# Patient Record
Sex: Male | Born: 1955 | Race: White | Hispanic: No | Smoking: Current every day smoker
Health system: Southern US, Community
[De-identification: ages and names within clinical notes are randomized; demographics above are authoritative.]

## PROBLEM LIST (undated history)

## (undated) DIAGNOSIS — I2510 Atherosclerotic heart disease of native coronary artery without angina pectoris: Secondary | ICD-10-CM

## (undated) DIAGNOSIS — I82409 Acute embolism and thrombosis of unspecified deep veins of unspecified lower extremity: Secondary | ICD-10-CM

## (undated) DIAGNOSIS — I10 Essential (primary) hypertension: Secondary | ICD-10-CM

## (undated) DIAGNOSIS — M1990 Unspecified osteoarthritis, unspecified site: Secondary | ICD-10-CM

## (undated) DIAGNOSIS — E785 Hyperlipidemia, unspecified: Secondary | ICD-10-CM

## (undated) DIAGNOSIS — F17200 Nicotine dependence, unspecified, uncomplicated: Secondary | ICD-10-CM

## (undated) HISTORY — PX: LUMBAR LAMINECTOMY: SHX95

---

## 2009-01-22 HISTORY — PX: VASCULAR SURGERY: SHX849

## 2009-11-19 ENCOUNTER — Encounter: Payer: Self-pay | Admitting: Vascular Surgery

## 2009-11-19 ENCOUNTER — Ambulatory Visit: Payer: Self-pay | Admitting: Vascular Surgery

## 2009-11-19 ENCOUNTER — Inpatient Hospital Stay (HOSPITAL_COMMUNITY)
Admission: EM | Admit: 2009-11-19 | Discharge: 2009-11-22 | Payer: Self-pay | Source: Home / Self Care | Admitting: Emergency Medicine

## 2009-11-22 ENCOUNTER — Encounter: Payer: Self-pay | Admitting: Vascular Surgery

## 2009-12-06 ENCOUNTER — Ambulatory Visit: Payer: Self-pay | Admitting: Vascular Surgery

## 2010-04-04 LAB — CBC
HCT: 40.3 % (ref 39.0–52.0)
Hemoglobin: 13.8 g/dL (ref 13.0–17.0)
MCHC: 34.2 g/dL (ref 30.0–36.0)
MCV: 85.2 fL (ref 78.0–100.0)
RDW: 13.1 % (ref 11.5–15.5)
WBC: 8.4 10*3/uL (ref 4.0–10.5)

## 2010-04-04 LAB — BASIC METABOLIC PANEL
BUN: 11 mg/dL (ref 6–23)
Chloride: 100 mEq/L (ref 96–112)
Glucose, Bld: 99 mg/dL (ref 70–99)
Potassium: 4.7 mEq/L (ref 3.5–5.1)
Sodium: 136 mEq/L (ref 135–145)

## 2010-04-05 LAB — BASIC METABOLIC PANEL
BUN: 11 mg/dL (ref 6–23)
BUN: 6 mg/dL (ref 6–23)
CO2: 25 mEq/L (ref 19–32)
CO2: 29 mEq/L (ref 19–32)
Chloride: 102 mEq/L (ref 96–112)
GFR calc non Af Amer: >60 mL/min (ref >60)
GFR calc non Af Amer: >60 mL/min (ref >60)
Glucose, Bld: 105 mg/dL — ABNORMAL HIGH (ref 70–99)
Glucose, Bld: 107 mg/dL — ABNORMAL HIGH (ref 70–99)
Potassium: 3.8 mEq/L (ref 3.5–5.1)
Potassium: 4 mEq/L (ref 3.5–5.1)
Sodium: 137 mEq/L (ref 135–145)

## 2010-04-05 LAB — CBC
HCT: 38.7 % — ABNORMAL LOW (ref 39.0–52.0)
HCT: 42.7 % (ref 39.0–52.0)
Hemoglobin: 13.1 g/dL (ref 13.0–17.0)
Hemoglobin: 14.8 g/dL (ref 13.0–17.0)
MCH: 29 pg (ref 26.0–34.0)
MCHC: 33.9 g/dL (ref 30.0–36.0)
MCV: 84.6 fL (ref 78.0–100.0)
MCV: 85.8 fL (ref 78.0–100.0)
RDW: 13.3 % (ref 11.5–15.5)
RDW: 13.5 % (ref 11.5–15.5)
WBC: 12.1 10*3/uL — ABNORMAL HIGH (ref 4.0–10.5)

## 2010-04-05 LAB — DIFFERENTIAL
Basophils Absolute: 0.1 10*3/uL (ref 0.0–0.1)
Eosinophils Relative: 2 % (ref 0–5)
Lymphocytes Relative: 23 % (ref 12–46)
Lymphs Abs: 2.7 10*3/uL (ref 0.7–4.0)
Monocytes Absolute: 0.6 10*3/uL (ref 0.1–1.0)
Monocytes Relative: 5 % (ref 3–12)
Neutro Abs: 8.4 10*3/uL — ABNORMAL HIGH (ref 1.7–7.7)

## 2010-04-05 LAB — APTT: aPTT: 33 seconds (ref 24–37)

## 2010-06-06 NOTE — Assessment & Plan Note (Signed)
OFFICE VISIT   James Porter, James Porter  DOB:  Feb 21, 1955                                       12/06/2009  CHART#:[id number]   James Porter presents today for follow-up of right femoral  thrombectomy on November 19, 2009.  He presented in her insulin with  acute ischemia.  He underwent thrombectomy and intraoperative  arteriogram.  He had no identifiable source for the thrombus.  He  underwent a 2-D echo which was negative and also  underwent a formal  arch and lower extremity arteriogram.  This did show mild to moderate  narrowing in the mid superficial femoral artery but otherwise normal.  He has  returned to his usual baseline.  He does report some numbness in  the medial thigh and I explained this was related to the skin incision  in his groin.  He has normal posterior tibial pulses bilaterally and  dorsalis pedis pulses bilaterally.  He has normal ankle arm indices and  normal waveforms bilaterally as well.  He will return to his usual  activities and has been released to return to work on November 30.  He  will see [place] again in 3 months for final follow-up.     Larina Earthly, M.D.  Electronically Signed   TFE/MEDQ  D:  12/06/2009  T:  12/07/2009  Job:  [id number]

## 2011-12-06 ENCOUNTER — Encounter: Payer: Self-pay | Admitting: Vascular Surgery

## 2017-04-17 ENCOUNTER — Ambulatory Visit: Payer: Medicare HMO | Admitting: Anesthesiology

## 2017-04-17 ENCOUNTER — Encounter
Admission: RE | Disposition: A | Discharge status: Home / Self Care | Payer: Self-pay | Source: Ambulatory Visit | Attending: Surgery

## 2017-04-17 ENCOUNTER — Ambulatory Visit
Admission: RE | Admit: 2017-04-17 | Discharge: 2017-04-17 | Disposition: A | Discharge status: Home / Self Care | Payer: Medicare HMO | Source: Ambulatory Visit | Attending: Surgery | Admitting: Surgery

## 2017-04-17 DIAGNOSIS — G5621 Lesion of ulnar nerve, right upper limb: Secondary | ICD-10-CM | POA: Insufficient documentation

## 2017-04-17 DIAGNOSIS — F17210 Nicotine dependence, cigarettes, uncomplicated: Secondary | ICD-10-CM | POA: Insufficient documentation

## 2017-04-17 DIAGNOSIS — Z79899 Other long term (current) drug therapy: Secondary | ICD-10-CM | POA: Insufficient documentation

## 2017-04-17 DIAGNOSIS — Z86718 Personal history of other venous thrombosis and embolism: Secondary | ICD-10-CM | POA: Diagnosis not present

## 2017-04-17 DIAGNOSIS — M1990 Unspecified osteoarthritis, unspecified site: Secondary | ICD-10-CM | POA: Diagnosis not present

## 2017-04-17 DIAGNOSIS — G5601 Carpal tunnel syndrome, right upper limb: Secondary | ICD-10-CM | POA: Diagnosis present

## 2017-04-17 HISTORY — DX: Unspecified osteoarthritis, unspecified site: M19.90

## 2017-04-17 HISTORY — DX: Acute embolism and thrombosis of unspecified deep veins of unspecified lower extremity: I82.409

## 2017-04-17 HISTORY — PX: CARPAL TUNNEL RELEASE: SHX101

## 2017-04-17 SURGERY — RELEASE, CARPAL TUNNEL, ENDOSCOPIC
Anesthesia: Regional | Laterality: Right | Wound class: "Clean "

## 2017-04-17 MED ORDER — CEFAZOLIN SODIUM-DEXTROSE 2-4 GM/100ML-% IV SOLN
2.0000 g | Freq: Once | INTRAVENOUS | Status: AC
  Administered 2017-04-17: 2 g via INTRAVENOUS

## 2017-04-17 MED ORDER — FENTANYL CITRATE (PF) 100 MCG/2ML IJ SOLN: 25.0000 ug | INTRAMUSCULAR | Status: DC | PRN

## 2017-04-17 MED ORDER — ONDANSETRON HCL 4 MG/2ML IJ SOLN: 4.0000 mg | Freq: Once | INTRAMUSCULAR | Status: DC | PRN

## 2017-04-17 MED ORDER — LIDOCAINE HCL (CARDIAC) 20 MG/ML IV SOLN
INTRAVENOUS | Status: DC | PRN
  Administered 2017-04-17: 50 mg via INTRAVENOUS

## 2017-04-17 MED ORDER — EPHEDRINE SULFATE 50 MG/ML IJ SOLN
INTRAMUSCULAR | Status: DC | PRN
  Administered 2017-04-17 (×4): 5 mg via INTRAVENOUS

## 2017-04-17 MED ORDER — PROPOFOL 500 MG/50ML IV EMUL
INTRAVENOUS | Status: DC | PRN
  Administered 2017-04-17: 75 ug/kg/min via INTRAVENOUS

## 2017-04-17 MED ORDER — FENTANYL CITRATE (PF) 100 MCG/2ML IJ SOLN
INTRAMUSCULAR | Status: DC | PRN
  Administered 2017-04-17: 50 ug via INTRAVENOUS

## 2017-04-17 MED ORDER — HYDROCODONE-ACETAMINOPHEN 5-325 MG PO TABS: 1.0000 | ORAL_TABLET | Freq: Four times a day (QID) | ORAL | 0 refills | Status: DC | PRN

## 2017-04-17 MED ORDER — OXYCODONE HCL 5 MG/5ML PO SOLN: 5.0000 mg | Freq: Once | ORAL | Status: DC | PRN

## 2017-04-17 MED ORDER — ACETAMINOPHEN 10 MG/ML IV SOLN: 1000.0000 mg | Freq: Once | INTRAVENOUS | Status: DC | PRN

## 2017-04-17 MED ORDER — ROPIVACAINE HCL 5 MG/ML IJ SOLN
INTRAMUSCULAR | Status: DC | PRN
  Administered 2017-04-17: 35 mL

## 2017-04-17 MED ORDER — LACTATED RINGERS IV SOLN: INTRAVENOUS | Status: DC

## 2017-04-17 MED ORDER — OXYCODONE HCL 5 MG PO TABS: 5.0000 mg | ORAL_TABLET | Freq: Once | ORAL | Status: DC | PRN

## 2017-04-17 MED ORDER — BUPIVACAINE HCL (PF) 0.5 % IJ SOLN
INTRAMUSCULAR | Status: DC | PRN
  Administered 2017-04-17: 20 mL

## 2017-04-17 MED ORDER — MIDAZOLAM HCL 2 MG/2ML IJ SOLN
INTRAMUSCULAR | Status: DC | PRN
  Administered 2017-04-17: 1 mg via INTRAVENOUS

## 2017-04-17 MED ORDER — LACTATED RINGERS IV SOLN
INTRAVENOUS | Status: DC
  Administered 2017-04-17: 13:00:00 via INTRAVENOUS

## 2017-04-17 SURGICAL SUPPLY — 32 items
BANDAGE ELASTIC 2 LF NS (GAUZE/BANDAGES/DRESSINGS) ×3 IMPLANT
BANDAGE ELASTIC 3 VELCRO NS (GAUZE/BANDAGES/DRESSINGS) ×2 IMPLANT
BENZOIN TINCTURE PRP APPL 2/3 (GAUZE/BANDAGES/DRESSINGS) ×2 IMPLANT
BNDG COHESIVE 4X5 TAN STRL (GAUZE/BANDAGES/DRESSINGS) ×3 IMPLANT
BNDG ESMARK 4X12 TAN STRL LF (GAUZE/BANDAGES/DRESSINGS) ×3 IMPLANT
CHLORAPREP W/TINT 26ML (MISCELLANEOUS) ×3 IMPLANT
CLOSURE WOUND 1/4X4 (GAUZE/BANDAGES/DRESSINGS) ×1
CORD BIP STRL DISP 12FT (MISCELLANEOUS) ×3 IMPLANT
COVER LIGHT HANDLE UNIVERSAL (MISCELLANEOUS) ×6 IMPLANT
CUFF TOURN SGL QUICK 18 (TOURNIQUET CUFF) ×2 IMPLANT
DRAPE SURG 17X11 SM STRL (DRAPES) ×3 IMPLANT
GAUZE PETRO XEROFOAM 1X8 (MISCELLANEOUS) ×3 IMPLANT
GAUZE SPONGE 4X4 12PLY STRL (GAUZE/BANDAGES/DRESSINGS) ×3 IMPLANT
GLOVE BIO SURGEON STRL SZ8 (GLOVE) ×3 IMPLANT
GLOVE INDICATOR 8.0 STRL GRN (GLOVE) ×3 IMPLANT
GOWN STRL REUS W/ TWL LRG LVL3 (GOWN DISPOSABLE) ×1 IMPLANT
GOWN STRL REUS W/ TWL XL LVL3 (GOWN DISPOSABLE) ×1 IMPLANT
GOWN STRL REUS W/TWL LRG LVL3 (GOWN DISPOSABLE) ×2
GOWN STRL REUS W/TWL XL LVL3 (GOWN DISPOSABLE) ×2
KIT CARPAL TUNNEL (MISCELLANEOUS) ×2
KIT ESCP INSRT D SLOT CANN KN (MISCELLANEOUS) ×1 IMPLANT
KIT TURNOVER KIT A (KITS) ×3 IMPLANT
NS IRRIG 500ML POUR BTL (IV SOLUTION) ×3 IMPLANT
PACK EXTREMITY ARMC (MISCELLANEOUS) ×3 IMPLANT
SLING ARM LRG DEEP (SOFTGOODS) ×2 IMPLANT
SPLINT WRIST LG RT TX900304 (SOFTGOODS) ×2 IMPLANT
STOCKINETTE IMPERVIOUS 9X36 MD (GAUZE/BANDAGES/DRESSINGS) ×3 IMPLANT
STRAP BODY AND KNEE 60X3 (MISCELLANEOUS) ×3 IMPLANT
STRIP CLOSURE SKIN 1/4X4 (GAUZE/BANDAGES/DRESSINGS) ×1 IMPLANT
SUT PROLENE 4 0 PS 2 18 (SUTURE) ×3 IMPLANT
SUT VIC AB 3-0 SH 27 (SUTURE) ×2
SUT VIC AB 3-0 SH 27X BRD (SUTURE) IMPLANT

## 2017-04-17 NOTE — Anesthesia Procedure Notes (Signed)
Anesthesia Regional Block: Supraclavicular block   Pre-Anesthetic Checklist: ,, timeout performed, Correct Patient, Correct Site, Correct Laterality, Correct Procedure, Correct Position, site marked, Risks and benefits discussed,  Surgical consent,  Pre-op evaluation,  At surgeon's request and post-op pain management  Laterality: Right  Prep: chloraprep       Needles:  Injection technique: Single-shot  Needle Type: Stimiplex     Needle Length: 4cm  Needle Gauge: 22     Additional Needles:   Procedures:,,,, ultrasound used (permanent image in chart),,,,  Narrative:  Start time: 04/17/2017 12:03 PM End time: 04/17/2017 12:10 PM Injection made incrementally with aspirations every 5 mL.  Performed by: Personally  Anesthesiologist: Reed BreechMazzoni, Cherlyn Syring, MD  Additional Notes: Functioning IV was confirmed and monitors applied.  Sterile prep and drape,hand hygiene and sterile gloves were used.Ultrasound guidance: relevant anatomy identified, needle position confirmed, local anesthetic spread visualized around nerve(s)., vascular puncture avoided.  Image printed for medical record.  Negative aspiration and negative test dose prior to incremental administration of local anesthetic. The patient tolerated the procedure well. Vitals signes recorded in RN notes.

## 2017-04-17 NOTE — Anesthesia Procedure Notes (Signed)
Performed by: Jillann Charette, CRNA Pre-anesthesia Checklist: Patient identified, Emergency Drugs available, Suction available, Patient being monitored and Timeout performed Patient Re-evaluated:Patient Re-evaluated prior to induction Oxygen Delivery Method: Simple face mask Placement Confirmation: positive ETCO2 and breath sounds checked- equal and bilateral       

## 2017-04-17 NOTE — Discharge Instructions (Addendum)
General Anesthesia, Adult, Care After These instructions provide you with information about caring for yourself after your procedure. Your health care provider may also give you more specific instructions. Your treatment has been planned according to current medical practices, but problems sometimes occur. Call your health care provider if you have any problems or questions after your procedure. What can I expect after the procedure? After the procedure, it is common to have:  Vomiting.  A sore throat.  Mental slowness.  It is common to feel:  Nauseous.  Cold or shivery.  Sleepy.  Tired.  Sore or achy, even in parts of your body where you did not have surgery.  Follow these instructions at home: For at least 24 hours after the procedure:  Do not: ? Participate in activities where you could fall or become injured. ? Drive. ? Use heavy machinery. ? Drink alcohol. ? Take sleeping pills or medicines that cause drowsiness. ? Make important decisions or sign legal documents. ? Take care of children on your own.  Rest. Eating and drinking  If you vomit, drink water, juice, or soup when you can drink without vomiting.  Drink enough fluid to keep your urine clear or pale yellow.  Make sure you have little or no nausea before eating solid foods.  Follow the diet recommended by your health care provider. General instructions  Have a responsible adult stay with you until you are awake and alert.  Return to your normal activities as told by your health care provider. Ask your health care provider what activities are safe for you.  Take over-the-counter and prescription medicines only as told by your health care provider.  If you smoke, do not smoke without supervision.  Keep all follow-up visits as told by your health care provider. This is important. Contact a health care provider if:  You continue to have nausea or vomiting at home, and medicines are not helpful.  You  cannot drink fluids or start eating again.  You cannot urinate after 8-12 hours.  You develop a skin rash.  You have fever.  You have increasing redness at the site of your procedure. Get help right away if:  You have difficulty breathing.  You have chest pain.  You have unexpected bleeding.  You feel that you are having a life-threatening or urgent problem. This information is not intended to replace advice given to you by your health care provider. Make sure you discuss any questions you have with your health care provider. Document Released: 04/16/2000 Document Revised: 06/13/2015 Document Reviewed: 12/23/2014 Elsevier Interactive Patient Education  2018 [employer]   Keep dressing dry and intact. Keep hand elevated above heart level. May shower after dressing removed on postop day 4 (Sunday). Cover sutures with Band-Aids after drying off, then re-apply velcro wrist splint. Apply ice to affected area frequently. Take ibuprofen 600 mg TID with meals for 7-10 days, then as necessary. Take ES Tylenol or pain medication as prescribed when needed.  Return for follow-up in 10-14 days or as scheduled.

## 2017-04-17 NOTE — Op Note (Signed)
04/17/2017  2:00 PM  Patient:   James Porter  Pre-Op Diagnosis:   1. Right carpal tunnel syndrome.  2. Right cubital tunnel syndrome.  Post-Op Diagnosis:   Same.  Procedure:   1. Endoscopic right carpal tunnel release.  2. Open decompression of ulnar nerve at right elbow.  Surgeon:   Maryagnes Amos, MD  Anesthesia:   Bier block  Findings:   As above.  Complications:   None  EBL:   0 cc  Fluids:   450 cc crystalloid  TT:   40 minutes at 250 mmHg  Drains:   None  Closure:   4-0 Prolene interrupted sutures for wrist, 3-0 Vicryl subcuticular sutures for elbow  Brief Clinical Note:   The patient is a 62 year old male with a history of progressively worsening right hand and upper extremity pain and paresthesias. His symptoms have progressed despite medications, activity modification, splinting, etc. His history and examination are consistent with both carpal tunnel syndrome and cubital tunnel syndrome as confirmed by EMG studies. The patient presents at this time for an endoscopic right carpal tunnel release as well as a decompression with possible subcutaneous anterior transposition of the ulnar nerve at the right elbow.   Procedure:   The patient underwent placement of a supraclavicular block by the anesthesiologist in the preoperative holding area before he was brought into the operating room and lain in the supine position. After adequate IV sedation was achieved, the right hand and upper extremity were prepped with ChloraPrep solution before being draped sterilely. Preoperative antibiotics were administered. A timeout was performed to verify the appropriate surgical site before the limb was exsanguinated with a Esmarch and the tourniquet inflated to 250 mmHg. An approximately 1.5-2 cm incision was made over the volar wrist flexion crease, centered over the palmaris longus tendon. The incision was carried down through the subcutaneous tissues with care taken to identify and  protect any neurovascular structures. The distal forearm fascia was penetrated just proximal to the transverse carpal ligament. The soft tissues were released off the superficial and deep surfaces of the distal forearm fascia and this was released proximally for 3-4 cm under direct visualization.  Attention was directed distally. The Therapist, nutritional was passed beneath the transverse carpal ligament along the ulnar aspect of the carpal tunnel and used to release any adhesions as well as to remove any adherent synovial tissue before first the smaller then the larger of the two dilators were passed beneath the transverse carpal ligament along the ulnar margin of the carpal tunnel. The slotted cannula was introduced and the endoscope was placed into the slotted cannula and the undersurface of the transverse carpal ligament visualized. The distal margin of the transverse carpal ligament was marked by placing a 25-gauge needle percutaneously at Kaplan's cardinal point so that it entered the distal portion of the slotted cannula. Under endoscopic visualization, the transverse carpal ligament was released from proximal to distal using the end-cutting blade. A second pass was performed to ensure complete release of the ligament. The adequacy of release was verified both endoscopically and by palpation using the freer elevator.  The wound was irrigated thoroughly with sterile saline solution before being closed using 4-0 Prolene interrupted sutures. A total of 10 cc of 0.5% plain Sensorcaine was injected in and around the incision.  Attention was directed to the right elbow. An approximately 7-8 cm curvilinear incision was made along the course of the ulnar nerve posterior to the medial epicondyle. The incision was carried  down through the subcutaneous tissues with care taken to avoid the small branches of the medial antebrachial nerve to expose the sheath overlying the cubital tunnel. The ulnar nerve was identified at  the proximal end of the tunnel and was dissected free. The nerve was then carefully followed as the roof of the cubital tunnel was released from proximal to distal. Distally, the fascia overlying the pronator muscle was released for several centimeters. The nerve was clearly thickened just proximal to fascia overlying the cubital tunnel, indicating the most likely source of the nerve compression. Near-circumferential dissection was carried out under loupe magnification using bipolar electrocautery and tenotomy scissors to be sure that the nerve was adequately decompressed. Given the size of the patient, it was felt best to leave the nerve in situ rather than to transpose the nerve anteriorly as there was very little subcutaneous fat to work with.  The wound was copiously irrigated with sterile saline solution before the subcutaneous tissues were closed using 3-0 Vicryl interrupted sutures. The skin was closed using benzoin and Steri-Strips. A total of 10 cc of 0.5% plain Sensorcaine was injected in and around the incision to help with postoperative analgesia. Sterile bulky dressings were applied to both wounds. The patient was placed into a volar wrist splint and a sling before being awakened and returned to the recovery room in satisfactory condition after tolerating the procedure well.

## 2017-04-17 NOTE — H&P (Signed)
Paper H&P to be scanned into permanent record. H&P reviewed and patient re-examined. No changes. 

## 2017-04-17 NOTE — Transfer of Care (Signed)
Immediate Anesthesia Transfer of Care Note  [facility]  Procedure(s) Performed: RIGHT CARPAL TUNNEL RELEASE ENDOSCOPIC WITH ANTERIOR TRANSPOSITION OF ULNAR NERVE OF RIGHT ELBOW (Right )  Patient Location: PACU  Anesthesia Type: General, Regional  Level of Consciousness: awake, alert  and patient cooperative  Airway and Oxygen Therapy: Patient Spontanous Breathing and Patient connected to supplemental oxygen  Post-op Assessment: Post-op Vital signs reviewed, Patient's Cardiovascular Status Stable, Respiratory Function Stable, Patent Airway and No signs of Nausea or vomiting  Post-op Vital Signs: Reviewed and stable  Complications: No apparent anesthesia complications

## 2017-04-17 NOTE — Anesthesia Preprocedure Evaluation (Signed)
Anesthesia Evaluation  Patient identified by MRN, date of birth, ID band Patient awake    Reviewed: Allergy & Precautions, NPO status , Patient's Chart, lab work & pertinent test results  History of Anesthesia Complications Negative for: history of anesthetic complications  Airway Mallampati: I  TM Distance: >3 FB Neck ROM: Full    Dental  (+) Edentulous Upper, Edentulous Lower   Pulmonary Current Smoker (1/2 ppd),    Pulmonary exam normal breath sounds clear to auscultation       Cardiovascular Exercise Tolerance: Good negative cardio ROS Normal cardiovascular exam Rhythm:Regular Rate:Normal     Neuro/Psych negative neurological ROS     GI/Hepatic negative GI ROS,   Endo/Other  negative endocrine ROS  Renal/GU negative Renal ROS     Musculoskeletal  (+) Arthritis , Osteoarthritis,    Abdominal   Peds  Hematology LE DVT 2011; not on anticoagulation   Anesthesia Other Findings   Reproductive/Obstetrics                             Anesthesia Physical Anesthesia Plan  ASA: II  Anesthesia Plan: General and Regional   Post-op Pain Management:  Regional for Post-op pain and GA combined w/ Regional for post-op pain   Induction: Intravenous  PONV Risk Score and Plan: 1 and Ondansetron  Airway Management Planned: Natural Airway  Additional Equipment:   Intra-op Plan:   Post-operative Plan:   Informed Consent: I have reviewed the patients History and Physical, chart, labs and discussed the procedure including the risks, benefits and alternatives for the proposed anesthesia with the patient or authorized representative who has indicated his/her understanding and acceptance.     Plan Discussed with: CRNA  Anesthesia Plan Comments: (Plan for supraclavicular nerve block and GA with natural airway (LMA backup).)        Anesthesia Quick Evaluation

## 2017-04-17 NOTE — Anesthesia Postprocedure Evaluation (Signed)
Anesthesia Post Note  Patient: James Porter  Procedure(s) Performed: RIGHT CARPAL TUNNEL RELEASE ENDOSCOPIC WITH ANTERIOR TRANSPOSITION OF ULNAR NERVE OF RIGHT ELBOW (Right )  Patient location during evaluation: PACU Anesthesia Type: Regional Level of consciousness: awake and alert, oriented and patient cooperative Pain management: pain level controlled Vital Signs Assessment: post-procedure vital signs reviewed and stable Respiratory status: spontaneous breathing, nonlabored ventilation and respiratory function stable Cardiovascular status: blood pressure returned to baseline and stable Postop Assessment: adequate PO intake Anesthetic complications: no    Reed BreechAndrea Surya Folden

## 2017-04-18 ENCOUNTER — Encounter: Payer: Self-pay | Admitting: Surgery

## 2020-06-06 ENCOUNTER — Inpatient Hospital Stay
Admission: EM | Admit: 2020-06-06 | Discharge: 2020-06-09 | DRG: 208 | Disposition: A | Discharge status: Skilled Nursing Facility | Payer: Medicare Other | Attending: Internal Medicine | Admitting: Internal Medicine

## 2020-06-06 ENCOUNTER — Inpatient Hospital Stay: Payer: Medicare Other

## 2020-06-06 ENCOUNTER — Emergency Department: Payer: Medicare Other

## 2020-06-06 ENCOUNTER — Other Ambulatory Visit: Payer: Self-pay

## 2020-06-06 ENCOUNTER — Encounter: Payer: Self-pay | Admitting: Pulmonary Disease

## 2020-06-06 DIAGNOSIS — Z79899 Other long term (current) drug therapy: Secondary | ICD-10-CM

## 2020-06-06 DIAGNOSIS — E785 Hyperlipidemia, unspecified: Secondary | ICD-10-CM | POA: Diagnosis present

## 2020-06-06 DIAGNOSIS — R4182 Altered mental status, unspecified: Secondary | ICD-10-CM | POA: Diagnosis present

## 2020-06-06 DIAGNOSIS — I2584 Coronary atherosclerosis due to calcified coronary lesion: Secondary | ICD-10-CM | POA: Diagnosis not present

## 2020-06-06 DIAGNOSIS — Z20822 Contact with and (suspected) exposure to covid-19: Secondary | ICD-10-CM | POA: Diagnosis present

## 2020-06-06 DIAGNOSIS — R55 Syncope and collapse: Secondary | ICD-10-CM

## 2020-06-06 DIAGNOSIS — R778 Other specified abnormalities of plasma proteins: Secondary | ICD-10-CM | POA: Diagnosis present

## 2020-06-06 DIAGNOSIS — J690 Pneumonitis due to inhalation of food and vomit: Secondary | ICD-10-CM | POA: Diagnosis present

## 2020-06-06 DIAGNOSIS — G9341 Metabolic encephalopathy: Secondary | ICD-10-CM | POA: Diagnosis present

## 2020-06-06 DIAGNOSIS — M62838 Other muscle spasm: Secondary | ICD-10-CM | POA: Diagnosis present

## 2020-06-06 DIAGNOSIS — R6510 Systemic inflammatory response syndrome (SIRS) of non-infectious origin without acute organ dysfunction: Secondary | ICD-10-CM | POA: Diagnosis present

## 2020-06-06 DIAGNOSIS — F17210 Nicotine dependence, cigarettes, uncomplicated: Secondary | ICD-10-CM | POA: Diagnosis present

## 2020-06-06 DIAGNOSIS — R404 Transient alteration of awareness: Secondary | ICD-10-CM | POA: Diagnosis present

## 2020-06-06 DIAGNOSIS — Z86718 Personal history of other venous thrombosis and embolism: Secondary | ICD-10-CM | POA: Diagnosis not present

## 2020-06-06 DIAGNOSIS — I10 Essential (primary) hypertension: Secondary | ICD-10-CM | POA: Diagnosis present

## 2020-06-06 DIAGNOSIS — J9601 Acute respiratory failure with hypoxia: Secondary | ICD-10-CM

## 2020-06-06 DIAGNOSIS — T1490XA Injury, unspecified, initial encounter: Secondary | ICD-10-CM

## 2020-06-06 DIAGNOSIS — Z01818 Encounter for other preprocedural examination: Secondary | ICD-10-CM

## 2020-06-06 DIAGNOSIS — R402431 Glasgow coma scale score 3-8, in the field [EMT or ambulance]: Secondary | ICD-10-CM | POA: Diagnosis not present

## 2020-06-06 DIAGNOSIS — M1990 Unspecified osteoarthritis, unspecified site: Secondary | ICD-10-CM | POA: Diagnosis present

## 2020-06-06 DIAGNOSIS — R230 Cyanosis: Secondary | ICD-10-CM | POA: Diagnosis present

## 2020-06-06 DIAGNOSIS — I2510 Atherosclerotic heart disease of native coronary artery without angina pectoris: Secondary | ICD-10-CM | POA: Diagnosis present

## 2020-06-06 DIAGNOSIS — Z791 Long term (current) use of non-steroidal anti-inflammatories (NSAID): Secondary | ICD-10-CM | POA: Diagnosis not present

## 2020-06-06 DIAGNOSIS — R402433 Glasgow coma scale score 3-8, at hospital admission: Secondary | ICD-10-CM | POA: Diagnosis not present

## 2020-06-06 DIAGNOSIS — I248 Other forms of acute ischemic heart disease: Secondary | ICD-10-CM | POA: Diagnosis present

## 2020-06-06 DIAGNOSIS — D649 Anemia, unspecified: Secondary | ICD-10-CM | POA: Diagnosis present

## 2020-06-06 DIAGNOSIS — R9431 Abnormal electrocardiogram [ECG] [EKG]: Secondary | ICD-10-CM | POA: Diagnosis not present

## 2020-06-06 DIAGNOSIS — R739 Hyperglycemia, unspecified: Secondary | ICD-10-CM | POA: Diagnosis present

## 2020-06-06 DIAGNOSIS — Z23 Encounter for immunization: Secondary | ICD-10-CM | POA: Diagnosis present

## 2020-06-06 HISTORY — DX: Essential (primary) hypertension: I10

## 2020-06-06 HISTORY — DX: Hyperlipidemia, unspecified: E78.5

## 2020-06-06 HISTORY — DX: Atherosclerotic heart disease of native coronary artery without angina pectoris: I25.10

## 2020-06-06 HISTORY — DX: Nicotine dependence, unspecified, uncomplicated: F17.200

## 2020-06-06 LAB — MRSA PCR SCREENING: MRSA by PCR: NEGATIVE

## 2020-06-06 LAB — APTT: aPTT: 31 seconds (ref 24–36)

## 2020-06-06 LAB — URINE DRUG SCREEN, QUALITATIVE (ARMC ONLY)
Amphetamines, Ur Screen: NOT DETECTED
Barbiturates, Ur Screen: NOT DETECTED
Benzodiazepine, Ur Scrn: NOT DETECTED
Cannabinoid 50 Ng, Ur ~~LOC~~: POSITIVE — AB
Cocaine Metabolite,Ur ~~LOC~~: NOT DETECTED
MDMA (Ecstasy)Ur Screen: NOT DETECTED
Methadone Scn, Ur: NOT DETECTED
Opiate, Ur Screen: NOT DETECTED
Phencyclidine (PCP) Ur S: NOT DETECTED
Tricyclic, Ur Screen: POSITIVE — AB

## 2020-06-06 LAB — URINALYSIS, COMPLETE (UACMP) WITH MICROSCOPIC
Bacteria, UA: NONE SEEN
Bilirubin Urine: NEGATIVE
Glucose, UA: 50 mg/dL — AB
Hgb urine dipstick: NEGATIVE
Ketones, ur: NEGATIVE mg/dL
Leukocytes,Ua: NEGATIVE
Nitrite: NEGATIVE
Protein, ur: NEGATIVE mg/dL
Specific Gravity, Urine: 1.016 (ref 1.005–1.030)
Squamous Epithelial / LPF: NONE SEEN (ref 0–5)
pH: 6 (ref 5.0–8.0)

## 2020-06-06 LAB — STREP PNEUMONIAE URINARY ANTIGEN: Strep Pneumo Urinary Antigen: NEGATIVE

## 2020-06-06 LAB — RESP PANEL BY RT-PCR (FLU A&B, COVID) ARPGX2
Influenza A by PCR: NEGATIVE
Influenza B by PCR: NEGATIVE
SARS Coronavirus 2 by RT PCR: NEGATIVE

## 2020-06-06 LAB — CBC WITH DIFFERENTIAL/PLATELET
Abs Immature Granulocytes: 0.06 10*3/uL (ref 0.00–0.07)
Basophils Absolute: 0.1 10*3/uL (ref 0.0–0.1)
Basophils Relative: 1 %
Eosinophils Absolute: 0.3 10*3/uL (ref 0.0–0.5)
Eosinophils Relative: 3 %
HCT: 38.1 % — ABNORMAL LOW (ref 39.0–52.0)
Hemoglobin: 12.9 g/dL — ABNORMAL LOW (ref 13.0–17.0)
Immature Granulocytes: 1 %
Lymphocytes Relative: 19 %
Lymphs Abs: 1.6 10*3/uL (ref 0.7–4.0)
MCH: 28.9 pg (ref 26.0–34.0)
MCHC: 33.9 g/dL (ref 30.0–36.0)
MCV: 85.2 fL (ref 80.0–100.0)
Monocytes Absolute: 0.7 10*3/uL (ref 0.1–1.0)
Monocytes Relative: 8 %
Neutro Abs: 5.8 10*3/uL (ref 1.7–7.7)
Neutrophils Relative %: 68 %
Platelets: 216 10*3/uL (ref 150–400)
RBC: 4.47 MIL/uL (ref 4.22–5.81)
RDW: 13 % (ref 11.5–15.5)
WBC: 8.4 10*3/uL (ref 4.0–10.5)
nRBC: 0 % (ref 0.0–0.2)

## 2020-06-06 LAB — TSH: TSH: 2.693 u[IU]/mL (ref 0.350–4.500)

## 2020-06-06 LAB — COMPREHENSIVE METABOLIC PANEL
ALT: 39 U/L (ref 0–44)
AST: 36 U/L (ref 15–41)
Albumin: 3.5 g/dL (ref 3.5–5.0)
Alkaline Phosphatase: 62 U/L (ref 38–126)
Anion gap: 7 (ref 5–15)
BUN: 18 mg/dL (ref 8–23)
CO2: 24 mmol/L (ref 22–32)
Calcium: 8.2 mg/dL — ABNORMAL LOW (ref 8.9–10.3)
Chloride: 108 mmol/L (ref 98–111)
Creatinine, Ser: 1.06 mg/dL (ref 0.61–1.24)
GFR, Estimated: >60 mL/min (ref >60)
Glucose, Bld: 152 mg/dL — ABNORMAL HIGH (ref 70–99)
Potassium: 3.9 mmol/L (ref 3.5–5.1)
Sodium: 139 mmol/L (ref 135–145)
Total Bilirubin: 0.7 mg/dL (ref 0.3–1.2)
Total Protein: 6 g/dL — ABNORMAL LOW (ref 6.5–8.1)

## 2020-06-06 LAB — HIV ANTIBODY (ROUTINE TESTING W REFLEX): HIV Screen 4th Generation wRfx: NONREACTIVE

## 2020-06-06 LAB — PROTIME-INR
INR: 1 (ref 0.8–1.2)
Prothrombin Time: 12.9 seconds (ref 11.4–15.2)

## 2020-06-06 LAB — CBG MONITORING, ED: Glucose-Capillary: 133 mg/dL — ABNORMAL HIGH (ref 70–99)

## 2020-06-06 LAB — TROPONIN I (HIGH SENSITIVITY)
Troponin I (High Sensitivity): 26 ng/L — ABNORMAL HIGH (ref <18)
Troponin I (High Sensitivity): 216 ng/L (ref <18)
Troponin I (High Sensitivity): 291 ng/L (ref <18)

## 2020-06-06 LAB — T4, FREE: Free T4: 1.16 ng/dL — ABNORMAL HIGH (ref 0.61–1.12)

## 2020-06-06 LAB — PROCALCITONIN: Procalcitonin: <0.1 ng/mL

## 2020-06-06 LAB — GLUCOSE, CAPILLARY
Glucose-Capillary: 144 mg/dL — ABNORMAL HIGH (ref 70–99)
Glucose-Capillary: 128 mg/dL — ABNORMAL HIGH (ref 70–99)
Glucose-Capillary: 113 mg/dL — ABNORMAL HIGH (ref 70–99)

## 2020-06-06 LAB — ACETAMINOPHEN LEVEL: Acetaminophen (Tylenol), Serum: <10 ug/mL — ABNORMAL LOW (ref 10–30)

## 2020-06-06 LAB — ETHANOL: Alcohol, Ethyl (B): <10 mg/dL (ref <10)

## 2020-06-06 LAB — CORTISOL: Cortisol, Plasma: 12.2 ug/dL

## 2020-06-06 LAB — SALICYLATE LEVEL: Salicylate Lvl: <7 mg/dL — ABNORMAL LOW (ref 7.0–30.0)

## 2020-06-06 MED ORDER — PNEUMOCOCCAL VAC POLYVALENT 25 MCG/0.5ML IJ INJ
0.5000 mL | INJECTION | INTRAMUSCULAR | Status: AC
  Administered 2020-06-09: 0.5 mL via INTRAMUSCULAR
  Filled 2020-06-06: qty 0.5

## 2020-06-06 MED ORDER — DOCUSATE SODIUM 50 MG/5ML PO LIQD
100.0000 mg | Freq: Two times a day (BID) | ORAL | Status: DC | PRN
  Filled 2020-06-06: qty 10

## 2020-06-06 MED ORDER — DOCUSATE SODIUM 100 MG PO CAPS: 100.0000 mg | ORAL_CAPSULE | Freq: Two times a day (BID) | ORAL | Status: DC | PRN

## 2020-06-06 MED ORDER — CHLORHEXIDINE GLUCONATE 0.12% ORAL RINSE (MEDLINE KIT)
15.0000 mL | Freq: Two times a day (BID) | OROMUCOSAL | Status: DC
  Administered 2020-06-06 – 2020-06-07 (×2): 15 mL via OROMUCOSAL
  Filled 2020-06-06 (×2): qty 15

## 2020-06-06 MED ORDER — PROPOFOL 1000 MG/100ML IV EMUL
5.0000 ug/kg/min | INTRAVENOUS | Status: DC
  Administered 2020-06-06: 5 ug/kg/min via INTRAVENOUS
  Administered 2020-06-06: 10 ug/kg/min via INTRAVENOUS
  Filled 2020-06-06 (×2): qty 100

## 2020-06-06 MED ORDER — SODIUM CHLORIDE 0.9 % IV SOLN
250.0000 mL | INTRAVENOUS | Status: DC
  Administered 2020-06-06: 250 mL via INTRAVENOUS

## 2020-06-06 MED ORDER — ETOMIDATE 2 MG/ML IV SOLN
20.0000 mg | Freq: Once | INTRAVENOUS | Status: AC
  Administered 2020-06-06: 20 mg via INTRAVENOUS

## 2020-06-06 MED ORDER — CHLORHEXIDINE GLUCONATE CLOTH 2 % EX PADS
6.0000 | MEDICATED_PAD | Freq: Every day | CUTANEOUS | Status: DC
  Administered 2020-06-06 – 2020-06-07 (×2): 6 via TOPICAL
  Filled 2020-06-06: qty 6

## 2020-06-06 MED ORDER — ONDANSETRON HCL 4 MG/2ML IJ SOLN
4.0000 mg | Freq: Four times a day (QID) | INTRAMUSCULAR | Status: DC | PRN
  Administered 2020-06-07: 4 mg via INTRAVENOUS
  Filled 2020-06-06: qty 2

## 2020-06-06 MED ORDER — FENTANYL 2500MCG IN NS 250ML (10MCG/ML) PREMIX INFUSION
0.0000 ug/h | INTRAVENOUS | Status: DC
  Administered 2020-06-06: 50 ug/h via INTRAVENOUS
  Filled 2020-06-06: qty 250

## 2020-06-06 MED ORDER — DEXMEDETOMIDINE HCL IN NACL 400 MCG/100ML IV SOLN
0.4000 ug/kg/h | INTRAVENOUS | Status: DC
  Administered 2020-06-06: 0.8 ug/kg/h via INTRAVENOUS
  Administered 2020-06-06: 0.4 ug/kg/h via INTRAVENOUS
  Administered 2020-06-07: 0.8 ug/kg/h via INTRAVENOUS
  Filled 2020-06-06 (×3): qty 100

## 2020-06-06 MED ORDER — IPRATROPIUM-ALBUTEROL 0.5-2.5 (3) MG/3ML IN SOLN: 3.0000 mL | RESPIRATORY_TRACT | Status: DC | PRN

## 2020-06-06 MED ORDER — LACTATED RINGERS IV SOLN: INTRAVENOUS | Status: DC

## 2020-06-06 MED ORDER — ENOXAPARIN SODIUM 40 MG/0.4ML IJ SOSY: 40.0000 mg | PREFILLED_SYRINGE | INTRAMUSCULAR | Status: DC

## 2020-06-06 MED ORDER — PANTOPRAZOLE SODIUM 40 MG IV SOLR
40.0000 mg | Freq: Every day | INTRAVENOUS | Status: DC
  Administered 2020-06-06 – 2020-06-07 (×2): 40 mg via INTRAVENOUS
  Filled 2020-06-06 (×2): qty 40

## 2020-06-06 MED ORDER — ORAL CARE MOUTH RINSE
15.0000 mL | OROMUCOSAL | Status: DC
  Administered 2020-06-06 – 2020-06-07 (×7): 15 mL via OROMUCOSAL
  Filled 2020-06-06 (×6): qty 15

## 2020-06-06 MED ORDER — SODIUM CHLORIDE 0.9 % IV SOLN: 500.0000 mg | Freq: Once | INTRAVENOUS | Status: DC

## 2020-06-06 MED ORDER — ROCURONIUM BROMIDE 50 MG/5ML IV SOLN
100.0000 mg | Freq: Once | INTRAVENOUS | Status: AC
  Administered 2020-06-06: 100 mg via INTRAVENOUS
  Filled 2020-06-06: qty 10

## 2020-06-06 MED ORDER — HEPARIN BOLUS VIA INFUSION
4000.0000 [IU] | Freq: Once | INTRAVENOUS | Status: AC
  Administered 2020-06-06: 4000 [IU] via INTRAVENOUS
  Filled 2020-06-06: qty 4000

## 2020-06-06 MED ORDER — POLYETHYLENE GLYCOL 3350 17 G PO PACK: 17.0000 g | PACK | Freq: Every day | ORAL | Status: DC | PRN

## 2020-06-06 MED ORDER — NOREPINEPHRINE 4 MG/250ML-% IV SOLN
2.0000 ug/min | INTRAVENOUS | Status: DC
  Administered 2020-06-06: 5 ug/min via INTRAVENOUS
  Filled 2020-06-06: qty 250

## 2020-06-06 MED ORDER — SODIUM CHLORIDE 0.9 % IV SOLN: 1.0000 g | Freq: Once | INTRAVENOUS | Status: DC

## 2020-06-06 MED ORDER — INSULIN ASPART 100 UNIT/ML IJ SOLN
0.0000 [IU] | INTRAMUSCULAR | Status: DC
  Administered 2020-06-06 (×2): 2 [IU] via SUBCUTANEOUS
  Administered 2020-06-09: 5 [IU] via SUBCUTANEOUS
  Filled 2020-06-06 (×3): qty 1

## 2020-06-06 MED ORDER — SODIUM CHLORIDE 0.9 % IV SOLN
3.0000 g | Freq: Four times a day (QID) | INTRAVENOUS | Status: DC
  Administered 2020-06-06 – 2020-06-08 (×7): 3 g via INTRAVENOUS
  Filled 2020-06-06: qty 8
  Filled 2020-06-06 (×2): qty 3
  Filled 2020-06-06: qty 8
  Filled 2020-06-06: qty 3
  Filled 2020-06-06: qty 8
  Filled 2020-06-06: qty 3
  Filled 2020-06-06: qty 8
  Filled 2020-06-06 (×2): qty 3
  Filled 2020-06-06: qty 8

## 2020-06-06 MED ORDER — HEPARIN (PORCINE) 25000 UT/250ML-% IV SOLN
1000.0000 [IU]/h | INTRAVENOUS | Status: DC
  Administered 2020-06-06: 1000 [IU]/h via INTRAVENOUS
  Filled 2020-06-06: qty 250

## 2020-06-06 NOTE — ED Triage Notes (Signed)
Pt from home via EMS. Wife called, found pt laying in bed "blue." Paramedics found patient apneic and blue. IO placed left lower leg. Wife concerned pt took oxy and percocet for chronic back pain, unsure how much pt might have taken. Received 14 mg narcan in route, responds briefly after each dose. GCS 3 per report.

## 2020-06-06 NOTE — ED Notes (Signed)
Pt now opening eyes spontaneously. Follows simple commands. Copious white in line secretions and clear oral secretions. Pt suctioned.

## 2020-06-06 NOTE — ED Notes (Signed)
Returned from CT. Daughter at bedside. Dr. Fuller Plan updated her.

## 2020-06-06 NOTE — ED Notes (Signed)
BAIR hugger placed to warm patient.

## 2020-06-06 NOTE — ED Notes (Signed)
Pt intubated by Dr Doris Cheadle. 8.0 ETT, 27 at the lip, positive color change. Bilateral breath sounds.

## 2020-06-06 NOTE — H&P (Signed)
NAME:  James Porter, MRN:  [id number], DOB:  07-06-1955, LOS: 0 ADMISSION DATE:  06/06/2020, CONSULTATION DATE:  06/06/2020 REFERRING MD:  Dr. Jari Pigg, CHIEF COMPLAINT: Altered mental status  Brief Pt Description/Synopsis  65 y.o. Male admitted with altered mental status, exact etiology unclear at this time (? possible drug overdose vs. ? Seizure) requiring intubation and mechanical ventilation for airway protection.  Concern for aspiration pneumonia.  History of Present Illness:  James Porter is a 65 year old man with a past medical history as listed below who presents to [facility] ED on 06/06/2020 due to altered mental status and potential drug overdose.  Patient is currently intubated, therefore history obtained from ED and nursing notes, along with telephone discussion with pt's wife.  Per notes and wife,  the patient was at home watching TV and suddenly became unresponsive, apneic, and cyanotic.  Bystander CPR was provided by his wife for approximately 20 minutes. However when EMS arrived there was a pulse. According to family they do not suspect that he intentionally overdosed on his or his wife's medications.  His wife reports "he takes BP medications, Gabapentin, and medications for muscle spasms." EMS did administer 12 mg of IV Narcan without any verbal response.  ED course: Upon arrival to the ED vital signs included: Temperature 95.5, respiratory rate 78, pulse 78, blood pressure 140/100, SPO2 98% on nonrebreather mask.  Despite Narcan he remained unresponsive with concern for ability to protect his airway, therefore he was electively intubated by ED provider due to a GCS of 3.  Work-up shows glucose 152 otherwise unremarkable comprehensive metabolic panel.  High-sensitivity troponin is 26, WBC 8.4, hemoglobin 12.9, hematocrit 38.1.  Urinalysis negative for UTI.  Urine drug screen is positive for tricyclics and marijuana.  Chest x-ray shows patchy atelectasis versus infiltrate in the left  perihilar region concerning for possible aspiration ammonia.  CT head is negative for acute intracranial abnormality, CT cervical spine is negative for acute cervical fracture.  PCCM is asked to admit the patient to ICU for further work-up and treatment of altered mental status with exact etiology unclear at this time (? possible drug overdose vs. ? seizure) requiring intubation and mechanical ventilation for airway protection.  Concern for aspiration pneumonia.   Pertinent  Medical History  Deep vein thrombosis Arthritis  Cultures  06/06/20: SARS CoV-2 & Influenza PCR>> 06/06/20: Tracheal aspirate>> 06/06/20: Strep pneumo urinary antigen>> 06/06/20: Legionella urinary antigen>> 06/06/20: Blood culture x2>>  Antimicrobials:  Azithromycin 5/16 x1 dose Ceftriaxone 5/16 x1 dose Unasyn 5/16>>  Significant Hospital Events: Including procedures, antibiotic start and stop dates in addition to other pertinent events   . 06/06/2020: Presented to ED, required intubation in ED for airway protection.  PCCM asked to admit to ICU  Interim History / Subjective:  -Presented to ED, required intubation in the ED for airway protection -Patient currently somnolent however is starting to arouse to painful stimuli and follow simple commands -Given copious amount of oral and ET tube secretions will hold off on extubation at this time -Will treat empirically for aspiration pneumonia -Hypothermic requiring Bair hugger -Hemodynamically stable, no vasopressors currently  Objective   Blood pressure (!) 151/102, pulse 67, temperature (!) 95.8 F (35.4 C), resp. rate 20, weight 90.7 kg, SpO2 97 %.    Vent Mode: AC FiO2 (%):  [45 %-100 %] 100 % Set Rate:  [16 bmp] 16 bmp Vt Set:  [550 mL] 550 mL PEEP:  [5 cmH20] 5 cmH20  No intake or output data in  the 24 hours ending 06/06/20 1700 Filed Weights   06/06/20 1520  Weight: 90.7 kg    Examination: General: Acutely ill-appearing male, laying in bed,  intubated, copious secretions coming from ET tube and orally, no acute distress HENT: Atraumatic, normocephalic, neck supple, no JVD Lungs: Coarse breath sounds bilaterally, vent assisted, even Cardiovascular: Regular rate and rhythm, S1-S2, no murmurs, rubs, gallops, 2+ distal pulses Abdomen: Soft, nontender, nondistended, no guarding rebound tenderness, bowel sounds positive x4 Extremities: Normal bulk and tone, no deformities, no edema Neuro: Somnolent, arouses to painful stimuli and moves all extremities to command now, pupils PERRLA at 3 mm bilaterally GU: Foley catheter in place  Labs/imaging that I havepersonally reviewed  (right click and "Reselect all SmartList Selections" daily)  Labs 06/06/2020: Glucose 152, BUN 18, creatinine 1.06, high-sensitivity troponin 26, WBC 8.4, hemoglobin 12.9, hematocrit 38.1, serum acetaminophen less than 10, salicylates less than 7 ABG post intubation: pH 7.36/PCO2 49/PO2 84/HCO3 27.7 Chest x-ray 06/06/2020:Endotracheal tube tip is about 2.7 cm superior to carina. Esophageal tube tip beneath the left hemidiaphragm. Right lung is grossly clear. Patchy atelectasis or minimal infiltrate in the left perihilar region. Normal heart size. No pneumothorax CT Head/Cervical Spine w/o contrast 06/06/2020:1. No evidence of acute intracranial abnormality. 2. No acute cervical spine fracture. 3. 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   Resolved Hospital Problem list     Assessment & Plan:   Intubated for airway protection in setting of Altered Mental Status Concern for possible aspiration Pneumonia -Full vent support, implement lung protective strategies -Wean FiO2 and PEEP as tolerated to maintain O2 sats greater than 92% -Follow intermittent chest x-ray and ABG as needed -Spontaneous breathing trials when respiratory parameters met and mental status permits -Implement VAP bundle -As needed  bronchodilators -Start empiric Unasyn  Meets SIRS Criteria (Hypothermic with temp. 95.5, RR 22) Suspected aspiration pneumonia -Monitor fever curve -Trend WBCs and procalcitonin -Follow cultures as above -Placed on empiric Unasyn for now pending cultures and sensitivities  Altered Mental Status, exact etiology unclear at this time (? Drug overdose vs ? seizure) Sedation needs in setting of mechanical ventilation -Maintain a RASS of 0 to -1 -Precedex/Propofol as needed to maintain RASS goal -Avoid sedating meds as able -Provide supportive care -Daily wake-up assessment -Patient is currently starting to arouse and follows some simple commands, however due to his copious secretions will hold off on extubation at this time -CT head 06/06/2020 negative for acute intracranial abnormality -Serum acetaminophen, salicylate, and ethyl alcohol are all negative -Urine drug screen is positive for tricyclics and marijuana -Obtain EEG -Check TSH and Cortisol  Mildly elevated troponin, suspect demand ischemia -Continuous cardiac monitoring -Follow QTC closely as patient positive for tricyclics on urine drug screen (initial QTc 510) -Maintain MAP greater than 65 -Trend HS Troponin  Normocytic Normochromic Anemia without overt s/sx of bleeding -Monitor for S/Sx of bleeding -Trend CBC -Lovenox for VTE Prophylaxis  -Transfuse for Hgb <7  Hyperglycemia -CBG's -SSI -Follow ICU Hypo/Hyperglycemia protocol -Check Hgb A1c    Best practice (right click and "Reselect all SmartList Selections" daily)  Diet:  NPO Pain/Anxiety/Delirium protocol (if indicated): Yes (RASS goal -1) VAP protocol (if indicated): Yes DVT prophylaxis: LMWH GI prophylaxis: PPI Glucose control:  SSI Central venous access:  N/A Arterial line:  N/A Foley: yes, and still needed Mobility:  bed rest  PT consulted: N/A Last date of multidisciplinary goals of care discussion [N/A] Code Status:  full code Disposition:  ICU  Updated pt's wife via telephone 06/06/20.  All questions answered.  Labs   CBC: Recent Labs  Lab 06/06/20 1500  WBC 8.4  NEUTROABS 5.8  HGB 12.9*  HCT 38.1*  MCV 85.2  PLT 257    Basic Metabolic Panel: Recent Labs  Lab 06/06/20 1500  NA 139  K 3.9  CL 108  CO2 24  GLUCOSE 152*  BUN 18  CREATININE 1.06  CALCIUM 8.2*   GFR: CrCl cannot be calculated (Unknown ideal weight.). Recent Labs  Lab 06/06/20 1500  WBC 8.4    Liver Function Tests: Recent Labs  Lab 06/06/20 1500  AST 36  ALT 39  ALKPHOS 62  BILITOT 0.7  PROT 6.0*  ALBUMIN 3.5   No results for input(s): LIPASE, AMYLASE in the last 168 hours. No results for input(s): AMMONIA in the last 168 hours.  ABG    Component Value Date/Time   HCO3 27.7 06/06/2020 1506   O2SAT 95.8 06/06/2020 1506     Coagulation Profile: No results for input(s): INR, PROTIME in the last 168 hours.  Cardiac Enzymes: No results for input(s): CKTOTAL, CKMB, CKMBINDEX, TROPONINI in the last 168 hours.  HbA1C: No results found for: HGBA1C  CBG: Recent Labs  Lab 06/06/20 1459  GLUCAP 133*    Review of Systems:   Unable to assess due to intubation, AMS, and critical illness  Past Medical History:  He,  has a past medical history of Arthritis and Deep vein thrombosis (DVT) ([place]).   Surgical History:   Past Surgical History:  Procedure Laterality Date  . CARPAL TUNNEL RELEASE Right 04/17/2017   Procedure: RIGHT CARPAL TUNNEL RELEASE ENDOSCOPIC WITH ANTERIOR TRANSPOSITION OF ULNAR NERVE OF RIGHT ELBOW;  Surgeon: Corky Mull, MD;  Location: [place];  Service: Orthopedics;  Laterality: Right;  . LUMBAR LAMINECTOMY    . VASCULAR SURGERY  2011   to remove blood clot in his leg     Social History:   reports that he has been smoking cigarettes. He has been smoking about 0.50 packs per day. He has never used smokeless tobacco. He reports that he does not drink alcohol and does not use drugs.    Family History:  His family history is not on file.   Allergies No Known Allergies   Home Medications  Prior to Admission medications   Medication Sig Start Date End Date Taking? Authorizing Provider  escitalopram (LEXAPRO) 10 MG tablet Take 10 mg by mouth daily.    [provider]  HYDROcodone-acetaminophen (NORCO/VICODIN) 5-325 MG tablet Take 1-2 tablets by mouth every 6 (six) hours as needed for moderate pain. 04/17/17   Poggi, Marshall Cork, MD  meloxicam (MOBIC) 7.5 MG tablet Take 7.5 mg by mouth daily.    [provider]     Critical care time: 55 minutes    Darel Hong, AGACNP-BC [facility] epic messenger for cross cover needs If after hours, please call E-link

## 2020-06-06 NOTE — Consult Note (Signed)
Pharmacy Antibiotic Note  James Porter is a 65 y.o. male admitted on 06/06/2020 with pneumonia.  Pharmacy has been consulted for Unaysn dosing.  Plan: Unasyn 3 g q6H.   Weight: 90.7 kg (200 lb)  Temp (24hrs), Avg:95.5 F (35.3 C), Min:94.6 F (34.8 C), Max:96.4 F (35.8 C)  Recent Labs  Lab 06/06/20 1500  WBC 8.4  CREATININE 1.06    CrCl cannot be calculated (Unknown ideal weight.).    No Known Allergies   Thank you for allowing pharmacy to be a part of this patient's care.  Ronnald Ramp, PharmD, BCPS 06/06/2020 5:05 PM

## 2020-06-06 NOTE — Consult Note (Signed)
ANTICOAGULATION CONSULT NOTE  Pharmacy Consult for Heparin Infusion Indication: chest pain/ACS  Patient Measurements: Weight: 90.7 kg (200 lb)  Labs: Recent Labs    06/06/20 1500 06/06/20 1841  HGB 12.9*  --   HCT 38.1*  --   PLT 216  --   CREATININE 1.06  --   TROPONINIHS 26* 216*    CrCl cannot be calculated (Unknown ideal weight.).   Medical History: Past Medical History:  Diagnosis Date  . Arthritis   . Deep vein thrombosis (DVT) ([facility])     Medications:  No anticoagulation prior to admission per my chart review  Assessment: Patient is a 65 y/o M with medical history as above who presented to the ED 5/16 with un-responsiveness after being found at home apneic and cyanotic appearing. Patient was emergently intubated in the ED. Pharmacy has been consulted to initiate heparin infusion for suspected ACS. Troponin 26 >> 216.   Baseline CBC overall within normal limits. Baseline aPTT and PT-INR pending.   Goal of Therapy:  Heparin level 0.3-0.7 units/ml Monitor platelets by anticoagulation protocol: Yes   Plan:  --Heparin 4000 unit IV bolus x 1 followed by continuous infusion at 1000 units/hr --Check HL 6 hours after initiation of infusion --Daily CBC per protocol while on IV heparin   Tressie Ellis 06/06/2020,8:19 PM

## 2020-06-06 NOTE — ED Provider Notes (Signed)
[facility] Emergency Department Provider Note  ____________________________________________   Event Date/Time   First MD Initiated Contact with Patient 06/06/20 1454     (approximate)  I have reviewed the triage vital signs and the nursing notes.   HISTORY  Chief Complaint Altered Mental Status    HPI James Porter is a 65 y.o. male unknown medical history who comes in for potential drug overdose.  Patient reportedly took some of his medication as well as some of his wife's medications.  Unclear how much or exactly what but was concerned for opioids and.  Patient was found unresponsive by family there was concern for seizure activity and it sounds like patient did get some bystander CPR however when EMS got there patient had a pulse.  Although patient was blue and apneic and had gotten a total of 12 mg of Narcan IV with out any verbal response.  May be started breathing a little bit more but was placed on a nonrebreather and transferred to the emergency room for further work-up and evaluation.  Unable to get full HPI due to patient's altered mental status       Past Medical History:  Diagnosis Date  . Arthritis   . Deep vein thrombosis (DVT) (HCC)     There are no problems to display for this patient.   Past Surgical History:  Procedure Laterality Date  . CARPAL TUNNEL RELEASE Right 04/17/2017   Procedure: RIGHT CARPAL TUNNEL RELEASE ENDOSCOPIC WITH ANTERIOR TRANSPOSITION OF ULNAR NERVE OF RIGHT ELBOW;  Surgeon: Corky Mull, MD;  Location: [facility];  Service: Orthopedics;  Laterality: Right;  . LUMBAR LAMINECTOMY    . VASCULAR SURGERY  2011   to remove blood clot in his leg    Prior to Admission medications   Medication Sig Start Date End Date Taking? Authorizing Provider  escitalopram (LEXAPRO) 10 MG tablet Take 10 mg by mouth daily.    [provider]  HYDROcodone-acetaminophen (NORCO/VICODIN) 5-325 MG tablet Take  1-2 tablets by mouth every 6 (six) hours as needed for moderate pain. 04/17/17   Poggi, Marshall Cork, MD  meloxicam (MOBIC) 7.5 MG tablet Take 7.5 mg by mouth daily.    [provider]    Allergies Patient has no known allergies.  No family history on file.  Social History Social History   Tobacco Use  . Smoking status: Current Every Day Smoker    Packs/day: 0.50    Types: Cigarettes  . Smokeless tobacco: Never Used  Substance Use Topics  . Alcohol use: No  . Drug use: No      Review of Systems Unable to get full review of systems due to patient's altered mental status ____________________________________________   PHYSICAL EXAM:  VITAL SIGNS: ED Triage Vitals [06/06/20 1459]  Enc Vitals Group     BP (!) 140/100     Pulse Rate 78     Resp (!) 22     Temp      Temp src      SpO2 100 %     Weight      Height      Head Circumference      Peak Flow      Pain Score      Pain Loc      Pain Edu?      Excl. in [place]?     Constitutional: Altered, unresponsive Eyes: Pupils are equal and sluggishly reactive bilaterally Head: Atraumatic. Nose: No congestion/rhinnorhea. Mouth/Throat:  Mucous membranes are moist.   Neck: No stridor. Trachea Midline. FROM Cardiovascular: Normal rate, regular rhythm. Grossly normal heart sounds.  Good peripheral circulation. Respiratory: Normal respiratory effort.  No retractions. Lungs CTAB. Gastrointestinal: Soft and nontender. No distention. No abdominal bruits.  Musculoskeletal: No lower extremity tenderness nor edema.  No joint effusions. Neurologic: Patient is nonverbal, no spontaneous movements, GCS of 3 Skin:  Skin is warm, dry and intact. No rash noted. Psychiatric: Unable to fully assess due to patient's altered mental status GU: Deferred   ____________________________________________   LABS (all labs ordered are listed, but only abnormal results are displayed)  Labs Reviewed  CBG MONITORING, ED - Abnormal; Notable for  the following components:      Result Value   Glucose-Capillary 133 (*)    All other components within normal limits   ____________________________________________   ED ECG REPORT I, Concha Se, the attending physician, personally viewed and interpreted this ECG.  Initial EKG is difficult to interpret due to the artifact but on repeat his rhythm is sinus rate of 82, no ST elevation, no T wave inversions, QTC of 510 ____________________________________________  RADIOLOGY Vela Prose, personally viewed and evaluated these images (plain radiographs) as part of my medical decision making, as well as reviewing the written report by the radiologist.  ED MD interpretation: ET tube in place  Official radiology report(s): CT Head Wo Contrast  Result Date: 06/06/2020 CLINICAL DATA:  Altered mental status. Found unresponsive and apneic. EXAM: CT HEAD WITHOUT CONTRAST CT CERVICAL SPINE WITHOUT CONTRAST TECHNIQUE: Multidetector CT imaging of the head and cervical spine was performed following the standard protocol without intravenous contrast. Multiplanar CT image reconstructions of the cervical spine were also generated. COMPARISON:  None. FINDINGS: CT HEAD FINDINGS Brain: There is no evidence of an acute infarct, intracranial hemorrhage, mass, midline shift, or extra-axial fluid collection. The ventricles and sulci are normal. Vascular: Calcified atherosclerosis at the skull base. No hyperdense vessel. Skull: No fracture or suspicious osseous lesion. Sinuses/Orbits: Moderate bilateral ethmoid sinus mucosal thickening. Clear mastoid air cells. Unremarkable included orbits. Other: None. CT CERVICAL SPINE FINDINGS Alignment: Mild reversal of the normal cervical lordosis. Trace anterolisthesis of C3 on C4 and trace retrolisthesis of C5 on C6 and C6 on C7. Skull base and vertebrae: No acute fracture or suspicious osseous lesion. Soft tissues and spinal canal: No prevertebral fluid or swelling. No visible  canal hematoma. Disc levels: Moderate disc degeneration at C5-6 and C6-7 and mild degeneration elsewhere. Advanced facet arthrosis on the right at C2-3 and on the left at C3-4. Moderate to severe neural foraminal stenosis on the left at C3-4, on the right at C5-6, and bilaterally at C6-7 and C7-T1 due to uncovertebral and facet spurring. At least mild spinal stenosis at C6-7. Upper chest: Reported separately. Other: Partially visualized endotracheal and enteric tubes. Mild calcific atherosclerosis at the carotid bifurcations. IMPRESSION: 1. No evidence of acute intracranial abnormality. 2. No acute cervical spine fracture. 3. 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 Electronically Signed   By: Sebastian Ache M.D.   On: 06/06/2020 16:23   CT Cervical Spine Wo Contrast  Result Date: 06/06/2020 CLINICAL DATA:  Altered mental status. Found unresponsive and apneic. EXAM: CT HEAD WITHOUT CONTRAST CT CERVICAL SPINE WITHOUT CONTRAST TECHNIQUE: Multidetector CT imaging of the head and cervical spine was performed following the standard protocol without intravenous contrast. Multiplanar CT image reconstructions of the cervical spine were also generated. COMPARISON:  None. FINDINGS: CT HEAD FINDINGS Brain: There is no evidence of an acute infarct, intracranial hemorrhage, mass, midline shift, or extra-axial fluid collection. The ventricles and sulci are normal. Vascular: Calcified atherosclerosis at the skull base. No hyperdense vessel. Skull: No fracture or suspicious osseous lesion. Sinuses/Orbits: Moderate bilateral ethmoid sinus mucosal thickening. Clear mastoid air cells. Unremarkable included orbits. Other: None. CT CERVICAL SPINE FINDINGS Alignment: Mild reversal of the normal cervical lordosis. Trace anterolisthesis of C3 on C4 and trace retrolisthesis of C5 on C6 and C6 on C7. Skull base and vertebrae: No acute fracture or suspicious osseous lesion. Soft  tissues and spinal canal: No prevertebral fluid or swelling. No visible canal hematoma. Disc levels: Moderate disc degeneration at C5-6 and C6-7 and mild degeneration elsewhere. Advanced facet arthrosis on the right at C2-3 and on the left at C3-4. Moderate to severe neural foraminal stenosis on the left at C3-4, on the right at C5-6, and bilaterally at C6-7 and C7-T1 due to uncovertebral and facet spurring. At least mild spinal stenosis at C6-7. Upper chest: Reported separately. Other: Partially visualized endotracheal and enteric tubes. Mild calcific atherosclerosis at the carotid bifurcations. IMPRESSION: 1. No evidence of acute intracranial abnormality. 2. No acute cervical spine fracture. 3. 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 Electronically Signed   By: Logan Bores M.D.   On: 06/06/2020 16:23   DG Chest Portable 1 View  Result Date: 06/06/2020 CLINICAL DATA:  Tube placement EXAM: PORTABLE CHEST 1 VIEW COMPARISON:  11/19/2009 FINDINGS: Endotracheal tube tip is about 2.7 cm superior to carina. Esophageal tube tip beneath the left hemidiaphragm. Right lung is grossly clear. Patchy atelectasis or minimal infiltrate in the left perihilar region. Normal heart size. No pneumothorax IMPRESSION: 1. Endotracheal tube tip about 2.7 cm superior to carina 2. Patchy atelectasis or minimal infiltrate in the left perihilar region Electronically Signed   By: Donavan Foil M.D.   On: 06/06/2020 15:48   CT CHEST ABDOMEN PELVIS WO CONTRAST  Result Date: 06/06/2020 CLINICAL DATA:  Found apneic. EXAM: CT CHEST, ABDOMEN AND PELVIS WITHOUT CONTRAST TECHNIQUE: Multidetector CT imaging of the chest, abdomen and pelvis was performed following the standard protocol without IV contrast. COMPARISON:  March 13, 2019 FINDINGS: CT CHEST FINDINGS Cardiovascular: There is mild calcification of the aortic arch. Normal heart size with mild coronary artery calcification. No  pericardial effusion. Mediastinum/Nodes: Endotracheal and nasogastric tubes are in place. No enlarged mediastinal, hilar, or axillary lymph nodes. Thyroid gland, trachea, and esophagus demonstrate no significant findings. Lungs/Pleura: Moderate severity areas of scarring, atelectasis and/or early infiltrate are seen within the posterior aspects of the bilateral upper lobes and bilateral lower lobes. There is no evidence of a pleural effusion or pneumothorax. Musculoskeletal: Acute nondisplaced anterior fourth left rib fracture is seen (axial CT image 34 through 37, CT series number 2). Degenerative changes are noted throughout the thoracic spine. CT ABDOMEN PELVIS FINDINGS Hepatobiliary: No focal liver abnormality is seen. No gallstones, gallbladder wall thickening, or biliary dilatation. Pancreas: Unremarkable. No pancreatic ductal dilatation or surrounding inflammatory changes. Spleen: Normal in size without focal abnormality. Adrenals/Urinary Tract: A stable 2.0 cm x 2.0 cm low-attenuation right adrenal mass is seen. Stable 1.1 cm x 0.8 cm and 1.3 cm x 1.0 cm low-attenuation left adrenal masses are also noted. Kidneys are normal, without renal calculi, focal lesion, or hydronephrosis. A Foley catheter is seen within the urinary bladder. Stomach/Bowel: Stomach is within normal limits. Appendix appears normal. No evidence of  bowel wall thickening, distention, or inflammatory changes. Noninflamed diverticula are seen throughout the sigmoid colon. Vascular/Lymphatic: Aortic atherosclerosis. No enlarged abdominal or pelvic lymph nodes. Reproductive: Prostate is unremarkable. Other: No abdominal wall hernia or abnormality. No abdominopelvic ascites. Musculoskeletal: Degenerative changes seen throughout the lumbar spine. IMPRESSION: 1. Moderate severity bilateral upper lobe and bilateral lower lobe scarring, atelectasis and/or early infiltrate. 2. 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 3. Sigmoid diverticulosis.  Electronically Signed   By: Virgina Norfolk M.D.   On: 06/06/2020 16:26    ____________________________________________   PROCEDURES  Procedure(s) performed (including Critical Care):  .1-3 Lead EKG Interpretation Performed by: Vanessa Buckley, MD Authorized by: Vanessa [place], MD     Interpretation: normal     ECG rate:  80s   ECG rate assessment: normal     Rhythm: sinus rhythm     Ectopy: none     Conduction: normal   .Critical Care Performed by: Vanessa Hopkins, MD Authorized by: Vanessa [place], MD   Critical care provider statement:    Critical care time (minutes):  45   Critical care was necessary to treat or prevent imminent or life-threatening deterioration of the following conditions:  CNS failure or compromise   Critical care was time spent personally by me on the following activities:  Discussions with consultants, evaluation of patient's response to treatment, examination of patient, ordering and performing treatments and interventions, ordering and review of laboratory studies, ordering and review of radiographic studies, pulse oximetry, re-evaluation of patient's condition, obtaining history from patient or surrogate and review of old charts Procedure Name: Intubation Date/Time: 06/06/2020 6:26 PM Performed by: Vanessa , MD Pre-anesthesia Checklist: Patient identified, Patient being monitored, Timeout performed, Emergency Drugs available and Suction available Oxygen Delivery Method: Non-rebreather mask Preoxygenation: Pre-oxygenation with 100% oxygen Induction Type: Rapid sequence Ventilation: Mask ventilation without difficulty Laryngoscope Size: Glidescope and 4 Grade View: Grade IV Tube size: 8.0 mm Number of attempts: 1 Airway Equipment and Method: Video-laryngoscopy Placement Confirmation: ETT inserted through vocal cords under direct vision Difficulty Due To: Difficulty was  unanticipated        ____________________________________________   INITIAL IMPRESSION / ASSESSMENT AND PLAN / ED COURSE  James Porter was evaluated in Emergency Department on 06/06/2020 for the symptoms described in the history of present illness. He was evaluated in the context of the global COVID-19 pandemic, which necessitated consideration that the patient might be at risk for infection with the SARS-CoV-2 virus that causes COVID-19. Institutional protocols and algorithms that pertain to the evaluation of patients at risk for COVID-19 are in a state of rapid change based on information released by regulatory bodies including the CDC and federal and state organizations. These policies and algorithms were followed during the patient's care in the ED.     Patient is a 65 year old who comes in with multiple potential drug overdose unresponsive to Narcan remains unresponsive not able to protect his airway therefore we elected to intubate patient due to GCS of 3.  CT imaging ordered to evaluate for intracranial hemorrhage, labs to evaluate for Electra normalities, AKI.  4:02 PM according to family they do not think that he take any medications.  The last known time at 130 and he was normal.  Patient remains off sedation unresponsive on ventilator.  Went to reevaluate patient to decide if patient would need MRI/MRA due to contrast shortage but patient was starting to wake up more but they did not feel like patient should be extubated  due to concern that he had a ton of secretions.  His chest x-ray was concerning for may be a developing pneumonia.  Patient did not meet SIRS criteria although he was only hypothermic so I did start patient on ceftriaxone and azithromycin.  CT imaging was reassuring otherwise.  Patient will be admitted to the ICU          ____________________________________________   FINAL CLINICAL IMPRESSION(S) / ED DIAGNOSES   Final diagnoses:  Glasgow coma  scale total score 3-8, in the field (EMT or ambulance) ([place])  Acute respiratory failure with hypoxia ([place])      MEDICATIONS GIVEN DURING THIS VISIT:  Medications  fentaNYL 2515mcg in NS 257mL (14mcg/ml) infusion-PREMIX (100 mcg/hr Intravenous Infusion Verify 06/06/20 1813)  propofol (DIPRIVAN) 1000 MG/100ML infusion (0 mcg/kg/min  90.7 kg Intravenous Stopped 06/06/20 1731)  dexmedetomidine (PRECEDEX) 400 MCG/100ML (4 mcg/mL) infusion (0.4 mcg/kg/hr  90.7 kg Intravenous New Bag/Given 06/06/20 1826)  polyethylene glycol (MIRALAX / GLYCOLAX) packet 17 g (has no administration in time range)  enoxaparin (LOVENOX) injection 40 mg (has no administration in time range)  pantoprazole (PROTONIX) injection 40 mg (has no administration in time range)  ondansetron (ZOFRAN) injection 4 mg (has no administration in time range)  ipratropium-albuterol (DUONEB) 0.5-2.5 (3) MG/3ML nebulizer solution 3 mL (has no administration in time range)  Ampicillin-Sulbactam (UNASYN) 3 g in sodium chloride 0.9 % 100 mL IVPB (has no administration in time range)  docusate (COLACE) 50 MG/5ML liquid 100 mg (has no administration in time range)  insulin aspart (novoLOG) injection 0-15 Units (2 Units Subcutaneous Given 06/06/20 1818)  Chlorhexidine Gluconate Cloth 2 % PADS 6 each (6 each Topical Given 06/06/20 1810)  chlorhexidine gluconate (MEDLINE KIT) (PERIDEX) 0.12 % solution 15 mL (has no administration in time range)  MEDLINE mouth rinse (15 mLs Mouth Rinse Given 06/06/20 1811)  lactated ringers infusion ( Intravenous New Bag/Given 06/06/20 1813)  etomidate (AMIDATE) injection 20 mg (20 mg Intravenous Given 06/06/20 1502)  rocuronium (ZEMURON) injection 100 mg (100 mg Intravenous Given 06/06/20 1503)     ED Discharge Orders    None       Note:  This document was prepared using Dragon voice recognition software and may include unintentional dictation errors.   Vanessa [place], MD 06/06/20 [phone]

## 2020-06-06 NOTE — Progress Notes (Addendum)
Elevated troponin secondary to N-STEMI vs demand ischemia PMHx: CAD, smoker, HTN, HLD Troponin: 26 >> 216. Initially suspected demand ischemia in the setting of acute respiratory failure and bystander CPR. However, with such a significant increase more suspicion for N-STEMI. Initial EKG: rte 82, NSR, LAD, suspected LAE, T wave inversions: V1 (unclear if baseline-no prior EKG for comparison), LAFB - initiate Heparin drip per pharmacy protocol - trend troponin - EKG at 00:00 to monitor Qtc - echocardiogram ordered - cardiology consulted, appreciate input    James Porter, AGACNP-BC Acute Care Nurse Practitioner [facility]   [phone] / [phone] Please see Amion for pager details.

## 2020-06-06 NOTE — ED Notes (Signed)
Plan to intubate for airway protection. Dr. Fuller Plan.

## 2020-06-06 NOTE — Plan of Care (Signed)
Patient admitted from ED intubated. NAD. VSS.    Problem: Education: Goal: Knowledge of General Education information will improve Description: Including pain rating scale, medication(s)/side effects and non-pharmacologic comfort measures Outcome: Progressing   Problem: Health Behavior/Discharge Planning: Goal: Ability to manage health-related needs will improve Outcome: Progressing   Problem: Clinical Measurements: Goal: Ability to maintain clinical measurements within normal limits will improve Outcome: Progressing Goal: Will remain free from infection Outcome: Progressing Goal: Diagnostic test results will improve Outcome: Progressing Goal: Respiratory complications will improve Outcome: Progressing Goal: Cardiovascular complication will be avoided Outcome: Progressing   Problem: Activity: Goal: Risk for activity intolerance will decrease Outcome: Progressing   Problem: Nutrition: Goal: Adequate nutrition will be maintained Outcome: Progressing   Problem: Coping: Goal: Level of anxiety will decrease Outcome: Progressing   Problem: Elimination: Goal: Will not experience complications related to bowel motility Outcome: Progressing Goal: Will not experience complications related to urinary retention Outcome: Progressing   Problem: Pain Managment: Goal: General experience of comfort will improve Outcome: Progressing   Problem: Safety: Goal: Ability to remain free from injury will improve Outcome: Progressing   Problem: Skin Integrity: Goal: Risk for impaired skin integrity will decrease Outcome: Progressing

## 2020-06-06 NOTE — ED Notes (Signed)
Daughter took all of patients belongings out of his pants home with her. Pt's son now at bedside visiting.

## 2020-06-07 ENCOUNTER — Inpatient Hospital Stay: Payer: Medicare Other

## 2020-06-07 DIAGNOSIS — R9431 Abnormal electrocardiogram [ECG] [EKG]: Secondary | ICD-10-CM

## 2020-06-07 DIAGNOSIS — I10 Essential (primary) hypertension: Secondary | ICD-10-CM

## 2020-06-07 DIAGNOSIS — R402431 Glasgow coma scale score 3-8, in the field [EMT or ambulance]: Secondary | ICD-10-CM | POA: Diagnosis not present

## 2020-06-07 DIAGNOSIS — J9601 Acute respiratory failure with hypoxia: Secondary | ICD-10-CM | POA: Diagnosis not present

## 2020-06-07 DIAGNOSIS — I248 Other forms of acute ischemic heart disease: Secondary | ICD-10-CM

## 2020-06-07 LAB — TRIGLYCERIDES: Triglycerides: 136 mg/dL (ref <150)

## 2020-06-07 LAB — BASIC METABOLIC PANEL
Anion gap: 8 (ref 5–15)
BUN: 18 mg/dL (ref 8–23)
CO2: 24 mmol/L (ref 22–32)
Calcium: 8.2 mg/dL — ABNORMAL LOW (ref 8.9–10.3)
Chloride: 106 mmol/L (ref 98–111)
Creatinine, Ser: 0.82 mg/dL (ref 0.61–1.24)
GFR, Estimated: >60 mL/min (ref >60)
Glucose, Bld: 101 mg/dL — ABNORMAL HIGH (ref 70–99)
Potassium: 3.7 mmol/L (ref 3.5–5.1)
Sodium: 138 mmol/L (ref 135–145)

## 2020-06-07 LAB — CBC
HCT: 38.2 % — ABNORMAL LOW (ref 39.0–52.0)
Hemoglobin: 13.1 g/dL (ref 13.0–17.0)
MCH: 28.6 pg (ref 26.0–34.0)
MCHC: 34.3 g/dL (ref 30.0–36.0)
MCV: 83.4 fL (ref 80.0–100.0)
Platelets: 255 10*3/uL (ref 150–400)
RBC: 4.58 MIL/uL (ref 4.22–5.81)
RDW: 13.2 % (ref 11.5–15.5)
WBC: 11.1 10*3/uL — ABNORMAL HIGH (ref 4.0–10.5)
nRBC: 0 % (ref 0.0–0.2)

## 2020-06-07 LAB — HEMOGLOBIN A1C
Hgb A1c MFr Bld: 6.1 % — ABNORMAL HIGH (ref 4.8–5.6)
Mean Plasma Glucose: 128.37 mg/dL

## 2020-06-07 LAB — HEPARIN LEVEL (UNFRACTIONATED): Heparin Unfractionated: 0.34 IU/mL (ref 0.30–0.70)

## 2020-06-07 LAB — GLUCOSE, CAPILLARY
Glucose-Capillary: 104 mg/dL — ABNORMAL HIGH (ref 70–99)
Glucose-Capillary: 101 mg/dL — ABNORMAL HIGH (ref 70–99)
Glucose-Capillary: 101 mg/dL — ABNORMAL HIGH (ref 70–99)
Glucose-Capillary: 99 mg/dL (ref 70–99)
Glucose-Capillary: 94 mg/dL (ref 70–99)

## 2020-06-07 LAB — PROCALCITONIN: Procalcitonin: <0.1 ng/mL

## 2020-06-07 LAB — MAGNESIUM: Magnesium: 1.9 mg/dL (ref 1.7–2.4)

## 2020-06-07 LAB — PHOSPHORUS: Phosphorus: 3.3 mg/dL (ref 2.5–4.6)

## 2020-06-07 LAB — TROPONIN I (HIGH SENSITIVITY): Troponin I (High Sensitivity): 262 ng/L (ref <18)

## 2020-06-07 MED ORDER — GABAPENTIN 300 MG PO CAPS
600.0000 mg | ORAL_CAPSULE | Freq: Three times a day (TID) | ORAL | Status: DC
  Administered 2020-06-07 – 2020-06-09 (×5): 600 mg via ORAL
  Filled 2020-06-07 (×5): qty 2

## 2020-06-07 MED ORDER — PROCHLORPERAZINE EDISYLATE 10 MG/2ML IJ SOLN
5.0000 mg | Freq: Once | INTRAMUSCULAR | Status: AC
  Administered 2020-06-07: 5 mg via INTRAVENOUS
  Filled 2020-06-07: qty 1

## 2020-06-07 MED ORDER — ORAL CARE MOUTH RINSE
15.0000 mL | Freq: Two times a day (BID) | OROMUCOSAL | Status: DC
  Administered 2020-06-07 – 2020-06-09 (×5): 15 mL via OROMUCOSAL

## 2020-06-07 MED ORDER — LORAZEPAM 2 MG/ML IJ SOLN
1.0000 mg | Freq: Once | INTRAMUSCULAR | Status: AC
  Administered 2020-06-07: 1 mg via INTRAVENOUS
  Filled 2020-06-07: qty 1

## 2020-06-07 MED ORDER — ESCITALOPRAM OXALATE 20 MG PO TABS
20.0000 mg | ORAL_TABLET | Freq: Every day | ORAL | Status: DC
  Administered 2020-06-07 – 2020-06-09 (×3): 20 mg via ORAL
  Filled 2020-06-07 (×3): qty 1

## 2020-06-07 NOTE — Procedures (Signed)
Patient Name: James Porter  MRN: [id number]  Epilepsy Attending: Charlsie Quest  Referring Physician/Provider: Harlon Ditty, NP Date: 06/07/2020 Duration: 25.12 mins  Patient history: 65 year old male with altered mental status.  EEG to evaluate for seizures.  Level of alertness:  lethargic   AEDs during EEG study: None  Technical aspects: This EEG study was done with scalp electrodes positioned according to the 10-20 International system of electrode placement. Electrical activity was acquired at a sampling rate of 500Hz  and reviewed with a high frequency filter of 70Hz  and a low frequency filter of 1Hz . EEG data were recorded continuously and digitally stored.   Description: The posterior dominant rhythm consists of 7 Hz activity of moderate voltage (25-35 uV) seen predominantly in posterior head regions, symmetric and reactive to eye opening and eye closing. EEG showed continuous generalized 5 to 7 Hz theta as well as intermittent generalized 2 to 3 Hz delta slowing. Physiologic photic driving was not seen during photic stimulation. Hyperventilation was not performed.     ABNORMALITY - Continuous slow, generalized - Background slow  IMPRESSION: This study is suggestive of mild to moderate diffuse encephalopathy, nonspecific etiology. No seizures or epileptiform discharges were seen throughout the recording.  Lorik Guo 

## 2020-06-07 NOTE — Progress Notes (Signed)
eeg done °

## 2020-06-07 NOTE — Consult Note (Signed)
Cardiology Consultation:   Patient ID: James Porter; [id number]; [birth year]   Admit date: 06/06/2020 Date of Consult: 06/07/2020  Primary Care Provider: Evie Lacks, NP (Inactive) Primary Cardiologist: New to [facility] - consult by End Primary Electrophysiologist:  None   Patient Profile:   James Porter is a 65 y.o. male with a hx of coronary artery calcification, HTN, tobacco use, adrenal adenoma, and chronic back pain who is being seen today for the evaluation of elevated troponin at the request of Mr. Elvina Sidle, NP.  History of Present Illness:   James Porter has no previously known cardiac history.  Prior lung cancer screening CT demonstrated LAD calcification and aortic atherosclerosis.  No formal cardiology studies available for review in Epic or Care Everywhere.  He was admitted to the hospital on 5/16 with AMS with concern for aspiration pneumonia, possibly related to drug overdose requiring mechanical ventilation.  Per prior documentation and history as supplied by the patient's wife, the patient was at home watching TV and suddenly became unresponsive, apneic, and cyanotic.  He underwent bystander CPR provided by his wife for approximately 20 minutes prior to EMS arrival.  When EMS arrived there was a pulse.  Family does not suspect he intentionally overdosed on medications.  He received 12 mg of IV Narcan by EMS without verbal response.  Vital signs were stable in the ED.  He was intubated for airway protection.  Urine drug screen was positive for tricyclics and marijuana.  CT head negative for acute intracranial abnormality.  CT cervical spine negative for acute cervical fracture.  Chest x-ray with patchy atelectasis versus infiltrate in the left perihilar region concerning for possible aspiration pneumonia.  Initial high-sensitivity troponin 26 with a delta of 216, subsequently peaking at 291 and downtrending.  EKG shows sinus rhythm with no acute ST-T changes as outlined  below.  Potassium 3.9, BUN/serum creatinine normal, glucose 152, WBC 8.4, Hgb 12.9, TSH normal.  Cardiology asked to evaluate elevated troponin.  Unable to obtain further history at this time as patient remains intubated.    Past Medical History:  Diagnosis Date  . Arthritis   . CAD (coronary artery disease)   . Deep vein thrombosis (DVT) (HCC)   . HTN (hypertension)   . Hyperlipidemia   . Smoker     Past Surgical History:  Procedure Laterality Date  . CARPAL TUNNEL RELEASE Right 04/17/2017   Procedure: RIGHT CARPAL TUNNEL RELEASE ENDOSCOPIC WITH ANTERIOR TRANSPOSITION OF ULNAR NERVE OF RIGHT ELBOW;  Surgeon: Christena Flake, MD;  Location: [facility];  Service: Orthopedics;  Laterality: Right;  . LUMBAR LAMINECTOMY    . VASCULAR SURGERY  2011   to remove blood clot in his leg     Home Meds: Prior to Admission medications   Medication Sig Start Date End Date Taking? Authorizing Provider  acetaminophen (TYLENOL) 650 MG CR tablet Take 650 mg by mouth every 8 (eight) hours as needed for pain.   Yes [provider]  atorvastatin (LIPITOR) 40 MG tablet Take 40 mg by mouth at bedtime.   Yes [provider]  cyclobenzaprine (FLEXERIL) 10 MG tablet Take 10 mg by mouth 2 (two) times daily as needed for muscle spasms.   Yes [provider]  escitalopram (LEXAPRO) 20 MG tablet Take 20 mg by mouth daily.   Yes [provider]  famotidine (PEPCID) 20 MG tablet Take 20 mg by mouth 2 (two) times daily.   Yes [provider]  gabapentin (  NEURONTIN) 300 MG capsule Take 600 mg by mouth 3 (three) times daily. 05/20/20  Yes [provider]  lisinopril (ZESTRIL) 10 MG tablet Take 10 mg by mouth daily.   Yes [provider]  meloxicam (MOBIC) 15 MG tablet Take 15 mg by mouth daily.   Yes [provider]  timolol (TIMOPTIC) 0.5 % ophthalmic solution Place 1 drop into both eyes 2 (two) times daily.   Yes [provider]     Inpatient Medications: Scheduled Meds: . chlorhexidine gluconate (MEDLINE KIT)  15 mL Mouth Rinse BID  . Chlorhexidine Gluconate Cloth  6 each Topical Daily  . insulin aspart  0-15 Units Subcutaneous Q4H  . mouth rinse  15 mL Mouth Rinse 10 times per day  . pantoprazole (PROTONIX) IV  40 mg Intravenous QHS  . [START ON 06/09/2020] pneumococcal 23 valent vaccine  0.5 mL Intramuscular Tomorrow-1000   Continuous Infusions: . sodium chloride 250 mL (06/06/20 2204)  . ampicillin-sulbactam (UNASYN) IV Stopped (06/07/20 4497)  . dexmedetomidine (PRECEDEX) IV infusion 0.8 mcg/kg/hr (06/07/20 0804)  . fentaNYL infusion INTRAVENOUS Stopped (06/07/20 0747)  . heparin 1,000 Units/hr (06/07/20 0804)  . lactated ringers Stopped (06/07/20 0544)  . norepinephrine (LEVOPHED) Adult infusion Stopped (06/07/20 0646)  . propofol (DIPRIVAN) infusion Stopped (06/07/20 0747)   PRN Meds: docusate, ipratropium-albuterol, ondansetron (ZOFRAN) IV, polyethylene glycol  Allergies:  No Known Allergies  Social History:   Social History   Socioeconomic History  . Marital status: Married    Spouse name: Not on file  . Number of children: Not on file  . Years of education: Not on file  . Highest education level: Not on file  Occupational History  . Not on file  Tobacco Use  . Smoking status: Current Every Day Smoker    Packs/day: 0.50    Types: Cigarettes  . Smokeless tobacco: Never Used  Substance and Sexual Activity  . Alcohol use: No  . Drug use: No  . Sexual activity: Not on file  Other Topics Concern  . Not on file  Social History Narrative  . Not on file   Social Determinants of Health   Financial Resource Strain: Not on file  Food Insecurity: Not on file  Transportation Needs: Not on file  Physical Activity: Not on file  Stress: Not on file  Social Connections: Not on file  Intimate Partner Violence: Not on file     Family History:   History reviewed. No pertinent family  history.  Unable to assess as patient is intubated at time of consult. Review of Care Everywhere indicates he has a brother that had an MI.   ROS:  Review of Systems  Unable to perform ROS: Intubated      Physical Exam/Data:   Vitals:   06/07/20 0830 06/07/20 0845 06/07/20 0900 06/07/20 0908  BP: (!) 79/60 (!) 80/62 (!) 76/59   Pulse: (!) 52 (!) 53 (!) 53   Resp: $Remo'16 16 16   'nIdnX$ Temp: 99.14 F (37.3 C) 99.32 F (37.4 C) 99.5 F (37.5 C)   TempSrc:      SpO2: 96% 96% 96% 97%  Weight:      Height:        Intake/Output Summary (Last 24 hours) at 06/07/2020 1015 Last data filed at 06/07/2020 0804 Gross per 24 hour  Intake 1948.27 ml  Output 950 ml  Net 998.27 ml   Filed Weights   06/06/20 1520 06/07/20 0419  Weight: 90.7 kg 90.7 kg   Body mass  index is 29.51 kg/m.   Physical Exam: General: Well developed, well nourished, in no acute distress. Head: Normocephalic, atraumatic, sclera non-icteric, no xanthomas, nares without discharge.  Neck: Negative for carotid bruits. JVD not elevated. Lungs: Vented breath sounds. Heart: RRR with S1 S2. No murmurs, rubs, or gallops appreciated. Abdomen: Soft, non-tender, non-distended with normoactive bowel sounds. No hepatomegaly. No rebound/guarding. No obvious abdominal masses. Msk:  Strength and tone appear normal for age. Extremities: No clubbing or cyanosis. No edema. Distal pedal pulses are 2+ and equal bilaterally. Neuro: Intubated, opens eyes when asked. Psych:  Intubated.   EKG:  The EKG was personally reviewed and demonstrates: NSR, 70 bpm, no acute ST-T changes Telemetry:  Telemetry was personally reviewed and demonstrates: SR with artifact  Weights: Filed Weights   06/06/20 1520 06/07/20 0419  Weight: 90.7 kg 90.7 kg    Relevant CV Studies:  2D echo pending  Laboratory Data:  Chemistry Recent Labs  Lab 06/06/20 1500 06/07/20 0453  NA 139 138  K 3.9 3.7  CL 108 106  CO2 24 24  GLUCOSE 152* 101*  BUN 18 18   CREATININE 1.06 0.82  CALCIUM 8.2* 8.2*  GFRNONAA >60 >60  ANIONGAP 7 8    Recent Labs  Lab 06/06/20 1500  PROT 6.0*  ALBUMIN 3.5  AST 36  ALT 39  ALKPHOS 62  BILITOT 0.7   Hematology Recent Labs  Lab 06/06/20 1500 06/07/20 0453  WBC 8.4 11.1*  RBC 4.47 4.58  HGB 12.9* 13.1  HCT 38.1* 38.2*  MCV 85.2 83.4  MCH 28.9 28.6  MCHC 33.9 34.3  RDW 13.0 13.2  PLT 216 255   Cardiac EnzymesNo results for input(s): TROPONINI in the last 168 hours. No results for input(s): TROPIPOC in the last 168 hours.  BNPNo results for input(s): BNP, PROBNP in the last 168 hours.  DDimer No results for input(s): DDIMER in the last 168 hours.  Radiology/Studies:  CT Head Wo Contrast  Result Date: 06/06/2020 IMPRESSION: 1. No evidence of acute intracranial abnormality. 2. No acute cervical spine fracture. 3. 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 Electronically Signed   By: Sebastian Ache M.D.   On: 06/06/2020 16:23   CT Cervical Spine Wo Contrast  Result Date: 06/06/2020 IMPRESSION: 1. No evidence of acute intracranial abnormality. 2. No acute cervical spine fracture. 3. 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 Electronically Signed   By: Sebastian Ache M.D.   On: 06/06/2020 16:23   DG Chest Port 1 View  Result Date: 06/07/2020 IMPRESSION: Support tubes in appropriate position. No focal pulmonary infiltrate. Electronically Signed   By: Helyn Numbers MD   On: 06/07/2020 05:10   DG Chest Port 1 View  Result Date: 06/06/2020 IMPRESSION: Tubes and lines as described above stable in appearance. No other focal abnormality is noted. Electronically Signed   By: Alcide Clever M.D.   On: 06/06/2020 20:00   DG Chest Portable 1 View  Result Date: 06/06/2020 IMPRESSION: 1. Endotracheal tube tip about 2.7 cm superior to carina 2. Patchy atelectasis or minimal infiltrate in the left  perihilar region Electronically Signed   By: Jasmine Pang M.D.   On: 06/06/2020 15:48   CT CHEST ABDOMEN PELVIS WO CONTRAST  Result Date: 06/06/2020 IMPRESSION: 1. Moderate severity bilateral upper lobe and bilateral lower lobe scarring, atelectasis and/or early infiltrate. 2. 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 3. Sigmoid diverticulosis. Electronically Signed  By: Virgina Norfolk M.D.   On: 06/06/2020 16:26    Assessment and Plan:   1.  Elevated troponin: -Mildly elevated and flat trending, not consistent with ACS, and likely in the setting of supply demand ischemia secondary to potential hypoxia/hypotension in the field and potential aspiration pneumonia -No indication for heparin drip at this time -Check echo -We will plan on ischemic evaluation pending echo and following improvement in the patient's acute illness -QTc stable  2.  Acute hypoxic respiratory failure with AMS and suspected aspiration pneumonia: -Management per CCM   For questions or updates, please contact [place] Please consult [email or web address] for contact info under Cardiology/STEMI.   Signed, Christell Faith, PA-C [facility] Pager: [phone] 06/07/2020, 10:15 AM

## 2020-06-07 NOTE — TOC Initial Note (Addendum)
Transition of Care [facility]) - Initial/Assessment Note    Patient Details  Name: James Porter MRN: [id number] Date of Birth: [birth year]  [facility] CM/SW Contact:    Marina Goodell Phone Number: [phone] 06/07/2020, 3:35 PM  Clinical Narrative:                  Patient presents to [facility] because patient's wife Earnest, Mcgillis (Spouse) [phone] found him "looking blue". Patient was found by EMS "blue in appearance and apneic". Ms. Ramcharan stated she was concerned the patient took Percocet and oxy for his back pain.  Patient is currently in ICU, extubated, PT/OT consulted for evaluation.  CSW spoke with patient's wife Ms. Wietchel with update.  Ms. Joseph Art stated they would follow the recommendations from PT/OT for either SNF or Home Health.  MS, Wietchel stated the patient is independent with all ADLs and is the primary care giver for Ms. Haggart and her sister.  Patient's primary physician is at [facility] in [place], [place] will continue to follow.  Expected Discharge Plan: Skilled Nursing Facility Barriers to Discharge: Continued Medical Work up   Patient Goals and CMS Choice        Expected Discharge Plan and Services Expected Discharge Plan: Skilled Nursing Facility In-house Referral: Clinical Social Work   Post Acute Care Choice: Skilled Nursing Facility Living arrangements for the past 2 months: Single Family Home                                      Prior Living Arrangements/Services Living arrangements for the past 2 months: Single Family Home Lives with:: Spouse,Relatives Patient language and need for interpreter reviewed:: No Do you feel safe going back to the place where you live?: Yes      Need for Family Participation in Patient Care: Yes (Comment) Care giver support system in place?: Yes (comment)   Criminal Activity/Legal Involvement Pertinent to Current Situation/Hospitalization: No - Comment as needed  Activities of  Daily Living Home Assistive Devices/Equipment: None ADL Screening (condition at time of admission) Patient's cognitive ability adequate to safely complete daily activities?: No Is the patient deaf or have difficulty hearing?: No Does the patient have difficulty seeing, even when wearing glasses/contacts?: Yes Does the patient have difficulty concentrating, remembering, or making decisions?: Yes Patient able to express need for assistance with ADLs?: No Does the patient have difficulty dressing or bathing?: Yes Independently performs ADLs?: No Does the patient have difficulty walking or climbing stairs?: Yes Weakness of Legs: Both Weakness of Arms/Hands: Both  Permission Sought/Granted Permission sought to share information with : Facility Medical sales representative    Share Information with NAME: Loden, Laurent (Spouse)   [phone]           Emotional Assessment Appearance:: Appears stated age Attitude/Demeanor/Rapport: Unable to Assess Affect (typically observed): Unable to Assess Orientation: : Oriented to Self Alcohol / Substance Use: Not Applicable Psych Involvement: No (comment)  Admission diagnosis:  Trauma [T14.90XA] Altered mental status, unspecified [R41.82] Patient Active Problem List   Diagnosis Date Noted  . Altered mental status, unspecified 06/06/2020   PCP:  Evie Lacks, NP (Inactive) Pharmacy:   [facility] [phone] [facility] [place], [place] - [address] AT [facility] [address] [place] [address] Phone: [phone] Fax: [phone]     Social Determinants of Health (SDOH) Interventions  Readmission Risk Interventions No flowsheet data found.

## 2020-06-07 NOTE — Progress Notes (Signed)
Patient extubated per Dr order. Patient extubated to 2L nasal cannula. Patient oxygen saturation 97% on 2L. No issues with extubation. Patient able to speak with no problem.

## 2020-06-07 NOTE — Consult Note (Signed)
ANTICOAGULATION CONSULT NOTE  Pharmacy Consult for Heparin Infusion Indication: chest pain/ACS  Patient Measurements: Height: 5' 9.02" (175.3 cm) Weight: 90.7 kg (199 lb 15.3 oz) IBW/kg (Calculated) : 70.74  Labs: Recent Labs    06/06/20 1500 06/06/20 1841 06/06/20 2058 06/06/20 2325 06/07/20 0453  HGB 12.9*  --   --   --  13.1  HCT 38.1*  --   --   --  38.2*  PLT 216  --   --   --  255  APTT  --   --  31  --   --   LABPROT  --   --  12.9  --   --   INR  --   --  1.0  --   --   HEPARINUNFRC  --   --   --   --  0.34  CREATININE 1.06  --   --   --   --   TROPONINIHS 26* 216* 291* 262*  --     Estimated Creatinine Clearance: 77.3 mL/min (by C-G formula based on SCr of 1.06 mg/dL).   Medical History: Past Medical History:  Diagnosis Date  . Arthritis   . CAD (coronary artery disease)   . Deep vein thrombosis (DVT) (HCC)   . HTN (hypertension)   . Hyperlipidemia   . Smoker     Medications:  No anticoagulation prior to admission per my chart review  Assessment: Patient is a 65 y/o M with medical history as above who presented to the ED 5/16 with un-responsiveness after being found at home apneic and cyanotic appearing. Patient was emergently intubated in the ED. Pharmacy has been consulted to initiate heparin infusion for suspected ACS. Troponin 26 >> 216.   Baseline CBC overall within normal limits. Baseline aPTT and PT-INR pending.   Goal of Therapy:  Heparin level 0.3-0.7 units/ml Monitor platelets by anticoagulation protocol: Yes   05/17 0453 HL 0.34, therapeutic x 1  Plan:  --Continue heparin infusion at 1000 units/hr --Recheck HL 6 hours to confirm --Daily CBC per protocol while on IV heparin   Otelia Sergeant, PharmD, [facility] 06/07/2020 5:37 AM

## 2020-06-07 NOTE — Progress Notes (Signed)
NAME:  James Porter, MRN:  [id number], DOB:  [birth year], LOS: 1 ADMISSION DATE:  06/06/2020, CONSULTATION DATE:  06/06/2020 REFERRING MD:  Dr. Jari Pigg, CHIEF COMPLAINT: Altered mental status  Brief Pt Description/Synopsis  65 y.o. Male admitted with altered mental status, exact etiology unclear at this time (? possible drug overdose vs. ? Seizure) requiring intubation and mechanical ventilation for airway protection.  Concern for aspiration pneumonia.  Circumstance of admission:  James Porter is a 65 year old man with a past medical history as listed below who presents to [facility] ED on 06/06/2020 due to altered mental status and potential drug overdose.  Patient is currently intubated, therefore history obtained from ED and nursing notes, along with telephone discussion with pt's wife.  Per notes and wife,  the patient was at home watching TV and suddenly became unresponsive, apneic, and cyanotic.  Bystander CPR was provided by his wife for approximately 20 minutes. However when EMS arrived there was a pulse. According to family they do not suspect that he intentionally overdosed on his or his wife's medications.  His wife reports "he takes BP medications, Gabapentin, and medications for muscle spasms." EMS did administer 12 mg of IV Narcan without any verbal response.  ED course: Upon arrival to the ED vital signs included: Temperature 95.5, respiratory rate 78, pulse 78, blood pressure 140/100, SPO2 98% on nonrebreather mask.  Despite Narcan he remained unresponsive with concern for ability to protect his airway, therefore he was electively intubated by ED provider due to a GCS of 3.  Work-up shows glucose 152 otherwise unremarkable comprehensive metabolic panel.  High-sensitivity troponin is 26, WBC 8.4, hemoglobin 12.9, hematocrit 38.1.  Urinalysis negative for UTI. Urine drug screen is positive for tricyclics and marijuana.  Chest x-ray shows patchy atelectasis versus infiltrate in the left  perihilar region concerning for possible aspiration ammonia.  CT head is negative for acute intracranial abnormality, CT cervical spine is negative for acute cervical fracture.  PCCM was asked to admit the patient to ICU for further work-up and treatment of altered mental status with exact etiology unclear at this time (? possible drug overdose vs. ? seizure) requiring intubation and mechanical ventilation for airway protection.  Concern for aspiration pneumonia.  Cultures  06/06/20: SARS CoV-2 & Influenza PCR>> negative 06/06/20: Tracheal aspirate>> 06/06/20: Strep pneumo urinary antigen>> negative 06/06/20: Legionella urinary antigen>> 06/06/20: Blood culture x2>>  Antimicrobials:  Azithromycin 5/16 x1 dose Ceftriaxone 5/16 x1 dose Unasyn 5/16>>  Significant Hospital Events: Including procedures, antibiotic start and stop dates in addition to other pertinent events   . 06/06/2020: Presented to ED, required intubation in ED for airway protection.  PCCM asked to admit to ICU . 06/07/2020: On SAT able to interact, follow commands.  SBT initiated.  Oral secretions markedly improved.  Troponins peaked at 300.  Interim History / Subjective:   Awake and alert, following commands  Initiating SBT today  No distress  Uneventful night  Objective   Blood pressure (!) 82/62, pulse (!) 54, temperature 99.5 F (37.5 C), resp. rate 16, height 5' 9.02" (1.753 m), weight 90.7 kg, SpO2 96 %.    Vent Mode: PRVC FiO2 (%):  [28 %-100 %] 28 % Set Rate:  [16 bmp] 16 bmp Vt Set:  [550 mL] 550 mL PEEP:  [5 cmH20] 5 cmH20   Intake/Output Summary (Last 24 hours) at 06/07/2020 0835 Last data filed at 06/07/2020 0804 Gross per 24 hour  Intake 1948.27 ml  Output 950 ml  Net 998.27 ml  Filed Weights   06/06/20 1520 06/07/20 0419  Weight: 90.7 kg 90.7 kg    Examination: General: Awake, interactive intubated, secretions from ET tube and orally have decreased significantly, no acute distress HENT:  Atraumatic, normocephalic, neck supple, no JVD Lungs: Coarse breath sounds bilaterally, vent assisted, even Cardiovascular: Regular rate and rhythm, S1-S2, no murmurs, rubs, gallops, 2+ distal pulses Abdomen: Soft, nontender, nondistended, no guarding rebound tenderness, bowel sounds positive x4 Extremities: Normal bulk and tone, no deformities, no edema Neuro: Awake, interactive, follows commands, PERRLA  GU: Foley catheter in place  Labs/imaging that I havepersonally reviewed  (right click and "Reselect all SmartList Selections" daily)  Labs 06/06/2020: Glucose 152, BUN 18, creatinine 1.06, high-sensitivity troponin 26, WBC 8.4, hemoglobin 12.9, hematocrit 38.1, serum acetaminophen less than 10, salicylates less than 7 ABG post intubation: pH 7.36/PCO2 49/PO2 84/HCO3 27.7 Chest x-ray 06/06/2020:Endotracheal tube tip is about 2.7 cm superior to carina. Esophageal tube tip beneath the left hemidiaphragm. Right lung is grossly clear. Patchy atelectasis or minimal infiltrate in the left perihilar region. Normal heart size. No pneumothorax CT Head/Cervical Spine w/o contrast 06/06/2020:1. No evidence of acute intracranial abnormality. 2. No acute cervical spine fracture. 3. 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   Resolved Hospital Problem list   Altered mental status  Assessment & Plan:   Intubated for airway protection in setting of Altered Mental Status Concern for possible aspiration Pneumonia -Full vent support, implement lung protective strategies -Wean FiO2 and PEEP as tolerated to maintain O2 sats greater than 92% -Follow intermittent chest x-ray and ABG as needed -Start spontaneous breathing trials  -Continue VAP bundle -As needed bronchodilators -Continue Unasyn -Hopefully will extubate today  Meets SIRS Criteria (Hypothermic with temp. 95.5, RR 22) Suspected aspiration pneumonia -Monitor fever curve -Trend WBCs  and procalcitonin -Follow cultures as above -Placed on empiric Unasyn for now pending cultures and sensitivities -Chest x-ray today shows no infiltrate, await sputum culture  Altered Mental Status, exact etiology unclear at this time (? Drug overdose vs ? seizure) Sedation needs in setting of mechanical ventilation -Maintain a RASS of 0  -Off of sedatives following commands -Avoid sedating meds as able -Provide supportive care -Daily wake-up assessment -CT head 06/06/2020 negative for acute intracranial abnormality -Serum acetaminophen, salicylate, and ethyl alcohol are all negative -Urine drug screen is positive for tricyclics and marijuana -Obtain EEG -TSH and Cortisol>> normal  Elevated troponin, suspect demand ischemia -Continuous cardiac monitoring -Follow QTC closely as patient positive for tricyclics on urine drug screen (initial QTc 510) -Maintain MAP greater than 65 -Troponin peaked at 291 -Recheck ECG today -On heparin as precaution -Echocardiogram pending  Normocytic Normochromic Anemia without overt s/sx of bleeding -H&H stable at baseline -Monitor for S/Sx of bleeding -Trend CBC  -Lovenox for VTE Prophylaxis  -Transfuse for Hgb <7  Hyperglycemia -CBG's -SSI -Follow ICU Hypo/Hyperglycemia protocol -Hgb A1c >> pending  Best practice (right click and "Reselect all SmartList Selections" daily)  Diet:  NPO Pain/Anxiety/Delirium protocol (if indicated): Yes (RASS goal 0) VAP protocol (if indicated): Yes DVT prophylaxis: LMWH GI prophylaxis: PPI Glucose control:  SSI Central venous access:  N/A Arterial line:  N/A Foley: yes, and still needed Mobility:  bed rest  PT consulted: N/A Last date of multidisciplinary goals of care discussion [N/A] Code Status:  full code Disposition: ICU    Labs   CBC: Recent Labs  Lab 06/06/20 1500 06/07/20 0453  WBC 8.4 11.1*  NEUTROABS 5.8  --  HGB 12.9* 13.1  HCT 38.1* 38.2*  MCV 85.2 83.4  PLT 216 255     Basic Metabolic Panel: Recent Labs  Lab 06/06/20 1500 06/07/20 0453  NA 139 138  K 3.9 3.7  CL 108 106  CO2 24 24  GLUCOSE 152* 101*  BUN 18 18  CREATININE 1.06 0.82  CALCIUM 8.2* 8.2*  MG  --  1.9  PHOS  --  3.3   GFR: Estimated Creatinine Clearance: 100 mL/min (by C-G formula based on SCr of 0.82 mg/dL). Recent Labs  Lab 06/06/20 1500 06/06/20 1841 06/07/20 0453  PROCALCITON  --  <0.10 <0.10  WBC 8.4  --  11.1*    Liver Function Tests: Recent Labs  Lab 06/06/20 1500  AST 36  ALT 39  ALKPHOS 62  BILITOT 0.7  PROT 6.0*  ALBUMIN 3.5   No results for input(s): LIPASE, AMYLASE in the last 168 hours. No results for input(s): AMMONIA in the last 168 hours.  ABG    Component Value Date/Time   HCO3 27.7 06/06/2020 1506   O2SAT 95.8 06/06/2020 1506     Coagulation Profile: Recent Labs  Lab 06/06/20 2058  INR 1.0    Cardiac Enzymes: 26>>216>>291>>262  HbA1C: No results found for: HGBA1C  CBG: Recent Labs  Lab 06/06/20 1816 06/06/20 1934 06/06/20 2324 06/07/20 0333 06/07/20 0724  GLUCAP 144* 128* 113* 104* 101*    Review of Systems:   Unable to assess due to intubation.  Allergies No Known Allergies   Scheduled Meds: . chlorhexidine gluconate (MEDLINE KIT)  15 mL Mouth Rinse BID  . Chlorhexidine Gluconate Cloth  6 each Topical Daily  . insulin aspart  0-15 Units Subcutaneous Q4H  . mouth rinse  15 mL Mouth Rinse 10 times per day  . pantoprazole (PROTONIX) IV  40 mg Intravenous QHS  . [START ON 06/09/2020] pneumococcal 23 valent vaccine  0.5 mL Intramuscular Tomorrow-1000   Continuous Infusions: . sodium chloride 250 mL (06/06/20 2204)  . ampicillin-sulbactam (UNASYN) IV Stopped (06/07/20 4034)  . dexmedetomidine (PRECEDEX) IV infusion 0.8 mcg/kg/hr (06/07/20 0804)  . fentaNYL infusion INTRAVENOUS Stopped (06/07/20 0747)  . heparin 1,000 Units/hr (06/07/20 0804)  . lactated ringers Stopped (06/07/20 0544)  . norepinephrine  (LEVOPHED) Adult infusion Stopped (06/07/20 0646)  . propofol (DIPRIVAN) infusion Stopped (06/07/20 0747)   PRN Meds:.docusate, ipratropium-albuterol, ondansetron (ZOFRAN) IV, polyethylene glycol     Critical care time: 40  minutes    The patient is critically ill with multiple organ systems failure and requires high complexity decision making for assessment and support, frequent evaluation and titration of therapies, application of advanced monitoring technologies and extensive interpretation of multiple databases. Critical Care Time devoted to patient care services described in this note is 40 minutes.  Case discussed at multidisciplinary rounds.  Renold Don, MD [facility]   *This note was dictated using voice recognition software/Dragon.  Despite best efforts to proofread, errors can occur which can change the meaning.  Any change was purely unintentional.

## 2020-06-08 ENCOUNTER — Inpatient Hospital Stay (HOSPITAL_COMMUNITY)
Admit: 2020-06-08 | Discharge: 2020-06-08 | Disposition: A | Discharge status: Home / Self Care | Payer: Medicare Other | Attending: Physician Assistant | Admitting: Physician Assistant

## 2020-06-08 DIAGNOSIS — I10 Essential (primary) hypertension: Secondary | ICD-10-CM | POA: Diagnosis not present

## 2020-06-08 DIAGNOSIS — R778 Other specified abnormalities of plasma proteins: Secondary | ICD-10-CM

## 2020-06-08 DIAGNOSIS — I2584 Coronary atherosclerosis due to calcified coronary lesion: Secondary | ICD-10-CM

## 2020-06-08 DIAGNOSIS — J9601 Acute respiratory failure with hypoxia: Secondary | ICD-10-CM | POA: Diagnosis not present

## 2020-06-08 DIAGNOSIS — J690 Pneumonitis due to inhalation of food and vomit: Secondary | ICD-10-CM

## 2020-06-08 DIAGNOSIS — I2510 Atherosclerotic heart disease of native coronary artery without angina pectoris: Secondary | ICD-10-CM

## 2020-06-08 LAB — CBC
HCT: 39.5 % (ref 39.0–52.0)
Hemoglobin: 13.7 g/dL (ref 13.0–17.0)
MCH: 29 pg (ref 26.0–34.0)
MCHC: 34.7 g/dL (ref 30.0–36.0)
MCV: 83.7 fL (ref 80.0–100.0)
Platelets: 227 10*3/uL (ref 150–400)
RBC: 4.72 MIL/uL (ref 4.22–5.81)
RDW: 12.7 % (ref 11.5–15.5)
WBC: 8.9 10*3/uL (ref 4.0–10.5)
nRBC: 0 % (ref 0.0–0.2)

## 2020-06-08 LAB — ECHOCARDIOGRAM COMPLETE
AR max vel: 2.66 cm2
AV Area VTI: 2.71 cm2
AV Area mean vel: 2.77 cm2
AV Mean grad: 5 mmHg
AV Peak grad: 9.6 mmHg
Ao pk vel: 1.55 m/s
Area-P 1/2: 3.99 cm2
Height: 69.016 in
S' Lateral: 2.29 cm
Weight: 3199.32 oz

## 2020-06-08 LAB — BASIC METABOLIC PANEL
Anion gap: 8 (ref 5–15)
BUN: 15 mg/dL (ref 8–23)
CO2: 25 mmol/L (ref 22–32)
Calcium: 8.5 mg/dL — ABNORMAL LOW (ref 8.9–10.3)
Chloride: 104 mmol/L (ref 98–111)
Creatinine, Ser: 0.78 mg/dL (ref 0.61–1.24)
GFR, Estimated: >60 mL/min (ref >60)
Glucose, Bld: 83 mg/dL (ref 70–99)
Potassium: 3.7 mmol/L (ref 3.5–5.1)
Sodium: 137 mmol/L (ref 135–145)

## 2020-06-08 LAB — GLUCOSE, CAPILLARY
Glucose-Capillary: 100 mg/dL — ABNORMAL HIGH (ref 70–99)
Glucose-Capillary: 74 mg/dL (ref 70–99)
Glucose-Capillary: 79 mg/dL (ref 70–99)
Glucose-Capillary: 81 mg/dL (ref 70–99)
Glucose-Capillary: 83 mg/dL (ref 70–99)
Glucose-Capillary: 91 mg/dL (ref 70–99)

## 2020-06-08 LAB — PROCALCITONIN: Procalcitonin: <0.1 ng/mL

## 2020-06-08 LAB — LEGIONELLA PNEUMOPHILA SEROGP 1 UR AG: L. pneumophila Serogp 1 Ur Ag: NEGATIVE

## 2020-06-08 LAB — MAGNESIUM: Magnesium: 1.8 mg/dL (ref 1.7–2.4)

## 2020-06-08 LAB — PHOSPHORUS: Phosphorus: 3.4 mg/dL (ref 2.5–4.6)

## 2020-06-08 MED ORDER — AMOXICILLIN-POT CLAVULANATE 875-125 MG PO TABS
1.0000 | ORAL_TABLET | Freq: Two times a day (BID) | ORAL | Status: DC
  Administered 2020-06-08 – 2020-06-09 (×3): 1 via ORAL
  Filled 2020-06-08 (×4): qty 1

## 2020-06-08 MED ORDER — TIMOLOL MALEATE 0.5 % OP SOLN
1.0000 [drp] | Freq: Two times a day (BID) | OPHTHALMIC | Status: DC
  Administered 2020-06-08: 1 [drp] via OPHTHALMIC
  Filled 2020-06-08: qty 5

## 2020-06-08 MED ORDER — ATORVASTATIN CALCIUM 20 MG PO TABS
40.0000 mg | ORAL_TABLET | Freq: Every day | ORAL | Status: DC
  Administered 2020-06-08: 40 mg via ORAL
  Filled 2020-06-08: qty 2

## 2020-06-08 MED ORDER — LISINOPRIL 5 MG PO TABS
10.0000 mg | ORAL_TABLET | Freq: Every day | ORAL | Status: DC
  Administered 2020-06-08 – 2020-06-09 (×2): 10 mg via ORAL
  Filled 2020-06-08 (×2): qty 2

## 2020-06-08 MED ORDER — CYCLOBENZAPRINE HCL 10 MG PO TABS
10.0000 mg | ORAL_TABLET | Freq: Two times a day (BID) | ORAL | Status: DC | PRN
  Filled 2020-06-08: qty 1

## 2020-06-08 MED ORDER — PANTOPRAZOLE SODIUM 40 MG PO TBEC
40.0000 mg | DELAYED_RELEASE_TABLET | Freq: Every day | ORAL | Status: DC
  Administered 2020-06-08 – 2020-06-09 (×2): 40 mg via ORAL
  Filled 2020-06-08 (×2): qty 1

## 2020-06-08 MED ORDER — MELOXICAM 7.5 MG PO TABS
15.0000 mg | ORAL_TABLET | Freq: Every day | ORAL | Status: DC
  Administered 2020-06-08 – 2020-06-09 (×2): 15 mg via ORAL
  Filled 2020-06-08 (×2): qty 2

## 2020-06-08 MED ORDER — FAMOTIDINE 20 MG PO TABS
20.0000 mg | ORAL_TABLET | Freq: Two times a day (BID) | ORAL | Status: DC
  Administered 2020-06-08 – 2020-06-09 (×3): 20 mg via ORAL
  Filled 2020-06-08 (×3): qty 1

## 2020-06-08 MED ORDER — ASPIRIN EC 81 MG PO TBEC
81.0000 mg | DELAYED_RELEASE_TABLET | Freq: Every day | ORAL | Status: DC
  Administered 2020-06-08 – 2020-06-09 (×2): 81 mg via ORAL
  Filled 2020-06-08 (×2): qty 1

## 2020-06-08 NOTE — Progress Notes (Signed)
*  PRELIMINARY RESULTS* Echocardiogram 2D Echocardiogram has been performed.  Cristela Blue 06/08/2020, 8:28 AM

## 2020-06-08 NOTE — Progress Notes (Signed)
Progress Note  Patient Name: James Porter Date of Encounter: 06/08/2020  [facility] Cardiologist: Dr. Okey Dupre  Subjective   Denies chest pain, breathing better, extubated.  Feels well, denies any history of heart disease.  Current smoker x40+ years.  Inpatient Medications    Scheduled Meds: . amoxicillin-clavulanate  1 tablet Oral Q12H  . atorvastatin  40 mg Oral QHS  . Chlorhexidine Gluconate Cloth  6 each Topical Daily  . escitalopram  20 mg Oral Daily  . famotidine  20 mg Oral BID  . gabapentin  600 mg Oral TID  . insulin aspart  0-15 Units Subcutaneous Q4H  . mouth rinse  15 mL Mouth Rinse BID  . meloxicam  15 mg Oral Daily  . pantoprazole (PROTONIX) IV  40 mg Intravenous QHS  . [START ON 06/09/2020] pneumococcal 23 valent vaccine  0.5 mL Intramuscular Tomorrow-1000  . timolol  1 drop Both Eyes BID   Continuous Infusions: . sodium chloride Stopped (06/07/20 1428)   PRN Meds: cyclobenzaprine, docusate, ipratropium-albuterol, polyethylene glycol   Vital Signs    Vitals:   06/08/20 0700 06/08/20 0800 06/08/20 0815 06/08/20 0900  BP:  134/84    Pulse: 75 78 79 74  Resp:      Temp:   98.5 F (36.9 C)   TempSrc:      SpO2: 96% 97% 94% 91%  Weight:      Height:        Intake/Output Summary (Last 24 hours) at 06/08/2020 1349 Last data filed at 06/08/2020 0643 Gross per 24 hour  Intake 1405.44 ml  Output 550 ml  Net 855.44 ml   Last 3 Weights 06/08/2020 06/07/2020 06/06/2020  Weight (lbs) 199 lb 15.3 oz 199 lb 15.3 oz 200 lb  Weight (kg) 90.7 kg 90.7 kg 90.719 kg      Telemetry    Sinus rhythm- Personally Reviewed  ECG    No new tracing obtained- Personally Reviewed  Physical Exam   GEN: No acute distress.   Neck: No JVD Cardiac: RRR, no murmurs, rubs, or gallops.  Respiratory: Clear to auscultation bilaterally. GI: Soft, nontender, non-distended  MS: No edema; No deformity. Neuro:  Nonfocal  Psych: Normal affect   Labs    High  Sensitivity Troponin:   Recent Labs  Lab 06/06/20 1500 06/06/20 1841 06/06/20 2058 06/06/20 2325  TROPONINIHS 26* 216* 291* 262*      Chemistry Recent Labs  Lab 06/06/20 1500 06/07/20 0453 06/08/20 0413  NA 139 138 137  K 3.9 3.7 3.7  CL 108 106 104  CO2 GLUCOSE 152* 101* 83  BUN CREATININE 1.06 0.82 0.78  CALCIUM 8.2* 8.2* 8.5*  PROT 6.0*  --   --   ALBUMIN 3.5  --   --   AST 36  --   --   ALT 39  --   --   ALKPHOS 62  --   --   BILITOT 0.7  --   --   GFRNONAA >60 >60 >60  ANIONGAP Hematology Recent Labs  Lab 06/06/20 1500 06/07/20 0453 06/08/20 0413  WBC 8.4 11.1* 8.9  RBC 4.47 4.58 4.72  HGB 12.9* 13.1 13.7  HCT 38.1* 38.2* 39.5  MCV 85.2 83.4 83.7  MCH 28.9 28.6 29.0  MCHC 33.9 34.3 34.7  RDW 13.0 13.2 12.7  PLT 216 255 227    BNPNo results for input(s): BNP, PROBNP in the  last 168 hours.   DDimer No results for input(s): DDIMER in the last 168 hours.   Radiology    CT Head Wo Contrast  Result Date: 06/06/2020 CLINICAL DATA:  Altered mental status. Found unresponsive and apneic. EXAM: CT HEAD WITHOUT CONTRAST CT CERVICAL SPINE WITHOUT CONTRAST TECHNIQUE: Multidetector CT imaging of the head and cervical spine was performed following the standard protocol without intravenous contrast. Multiplanar CT image reconstructions of the cervical spine were also generated. COMPARISON:  None. FINDINGS: CT HEAD FINDINGS Brain: There is no evidence of an acute infarct, intracranial hemorrhage, mass, midline shift, or extra-axial fluid collection. The ventricles and sulci are normal. Vascular: Calcified atherosclerosis at the skull base. No hyperdense vessel. Skull: No fracture or suspicious osseous lesion. Sinuses/Orbits: Moderate bilateral ethmoid sinus mucosal thickening. Clear mastoid air cells. Unremarkable included orbits. Other: None. CT CERVICAL SPINE FINDINGS Alignment: Mild reversal of the normal cervical lordosis. Trace  anterolisthesis of C3 on C4 and trace retrolisthesis of C5 on C6 and C6 on C7. Skull base and vertebrae: No acute fracture or suspicious osseous lesion. Soft tissues and spinal canal: No prevertebral fluid or swelling. No visible canal hematoma. Disc levels: Moderate disc degeneration at C5-6 and C6-7 and mild degeneration elsewhere. Advanced facet arthrosis on the right at C2-3 and on the left at C3-4. Moderate to severe neural foraminal stenosis on the left at C3-4, on the right at C5-6, and bilaterally at C6-7 and C7-T1 due to uncovertebral and facet spurring. At least mild spinal stenosis at C6-7. Upper chest: Reported separately. Other: Partially visualized endotracheal and enteric tubes. Mild calcific atherosclerosis at the carotid bifurcations. IMPRESSION: 1. No evidence of acute intracranial abnormality. 2. No acute cervical spine fracture. 3. 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 Electronically Signed   By: Sebastian Ache M.D.   On: 06/06/2020 16:23   CT Cervical Spine Wo Contrast  Result Date: 06/06/2020 CLINICAL DATA:  Altered mental status. Found unresponsive and apneic. EXAM: CT HEAD WITHOUT CONTRAST CT CERVICAL SPINE WITHOUT CONTRAST TECHNIQUE: Multidetector CT imaging of the head and cervical spine was performed following the standard protocol without intravenous contrast. Multiplanar CT image reconstructions of the cervical spine were also generated. COMPARISON:  None. FINDINGS: CT HEAD FINDINGS Brain: There is no evidence of an acute infarct, intracranial hemorrhage, mass, midline shift, or extra-axial fluid collection. The ventricles and sulci are normal. Vascular: Calcified atherosclerosis at the skull base. No hyperdense vessel. Skull: No fracture or suspicious osseous lesion. Sinuses/Orbits: Moderate bilateral ethmoid sinus mucosal thickening. Clear mastoid air cells. Unremarkable included orbits. Other: None. CT CERVICAL SPINE  FINDINGS Alignment: Mild reversal of the normal cervical lordosis. Trace anterolisthesis of C3 on C4 and trace retrolisthesis of C5 on C6 and C6 on C7. Skull base and vertebrae: No acute fracture or suspicious osseous lesion. Soft tissues and spinal canal: No prevertebral fluid or swelling. No visible canal hematoma. Disc levels: Moderate disc degeneration at C5-6 and C6-7 and mild degeneration elsewhere. Advanced facet arthrosis on the right at C2-3 and on the left at C3-4. Moderate to severe neural foraminal stenosis on the left at C3-4, on the right at C5-6, and bilaterally at C6-7 and C7-T1 due to uncovertebral and facet spurring. At least mild spinal stenosis at C6-7. Upper chest: Reported separately. Other: Partially visualized endotracheal and enteric tubes. Mild calcific atherosclerosis at the carotid bifurcations. IMPRESSION: 1. No evidence of acute intracranial abnormality. 2. No acute cervical spine fracture. 3. Cervical disc and facet  degeneration with moderate to severe multilevel neural foraminal stenosis. Electronically Signed   By: Sebastian Ache M.D.   On: 06/06/2020 16:23   DG Chest Port 1 View  Result Date: 06/07/2020 CLINICAL DATA:  Acute respiratory failure EXAM: PORTABLE CHEST 1 VIEW COMPARISON:  06/06/2020 FINDINGS: Endotracheal tube seen 3.4 cm above the carina. Nasogastric tube extends into the upper gastric fundus. Lungs are clear. No pneumothorax or pleural effusion. Cardiac size within normal limits. Pulmonary vascularity is normal. No acute bone abnormality. IMPRESSION: Support tubes in appropriate position. No focal pulmonary infiltrate. Electronically Signed   By: Helyn Numbers MD   On: 06/07/2020 05:10   DG Chest Port 1 View  Result Date: 06/06/2020 CLINICAL DATA:  Status post intubation EXAM: PORTABLE CHEST 1 VIEW COMPARISON:  Film from earlier in the same day. FINDINGS: Endotracheal tube and gastric catheter are noted in satisfactory position and stable. Lungs are clear. No  bony abnormality is noted. IMPRESSION: Tubes and lines as described above stable in appearance. No other focal abnormality is noted. Electronically Signed   By: Alcide Clever M.D.   On: 06/06/2020 20:00   DG Chest Portable 1 View  Result Date: 06/06/2020 CLINICAL DATA:  Tube placement EXAM: PORTABLE CHEST 1 VIEW COMPARISON:  11/19/2009 FINDINGS: Endotracheal tube tip is about 2.7 cm superior to carina. Esophageal tube tip beneath the left hemidiaphragm. Right lung is grossly clear. Patchy atelectasis or minimal infiltrate in the left perihilar region. Normal heart size. No pneumothorax IMPRESSION: 1. Endotracheal tube tip about 2.7 cm superior to carina 2. Patchy atelectasis or minimal infiltrate in the left perihilar region Electronically Signed   By: Jasmine Pang M.D.   On: 06/06/2020 15:48   EEG adult  Result Date: 06/07/2020 Charlsie Quest, MD     06/07/2020  3:28 PM Patient Name: LATTIE CERVI MRN: [id number] Epilepsy Attending: Charlsie Quest Referring Physician/Provider: Harlon Ditty, NP Date: 06/07/2020 Duration: 25.12 mins Patient history: 65 year old male with altered mental status.  EEG to evaluate for seizures. Level of alertness:  lethargic AEDs during EEG study: None Technical aspects: This EEG study was done with scalp electrodes positioned according to the 10-20 International system of electrode placement. Electrical activity was acquired at a sampling rate of 500Hz  and reviewed with a high frequency filter of 70Hz  and a low frequency filter of 1Hz . EEG data were recorded continuously and digitally stored. Description: The posterior dominant rhythm consists of 7 Hz activity of moderate voltage (25-35 uV) seen predominantly in posterior head regions, symmetric and reactive to eye opening and eye closing. EEG showed continuous generalized 5 to 7 Hz theta as well as intermittent generalized 2 to 3 Hz delta slowing. Physiologic photic driving was not seen during photic stimulation.  Hyperventilation was not performed.   ABNORMALITY - Continuous slow, generalized - Background slow IMPRESSION: This study is suggestive of mild to moderate diffuse encephalopathy, nonspecific etiology. No seizures or epileptiform discharges were seen throughout the recording.   ECHOCARDIOGRAM COMPLETE  Result Date: 06/08/2020    ECHOCARDIOGRAM REPORT   Patient Name:   James Porter [facility] Date of Exam: 06/08/2020 Medical Rec #:  Steele Berg          Height:       69.0 in Accession #:    [id number]         Weight:       200.0 lb Date of Birth:  October 20, 1955           BSA:  2.066 m Patient Age:    64 years           BP:           134/83 mmHg Patient Gender: M                  HR:           76 bpm. Exam Location:  [facility] Procedure: 2D Echo, Color Doppler and Cardiac Doppler Indications:     NSTEMI I21.4  History:         Patient has no prior history of Echocardiogram examinations.                  CAD; Risk Factors:Hypertension.  Sonographer:     Cristela Blue RDCS (AE) Referring Phys:  [id number] Sondra Barges Diagnosing Phys: Debbe Odea MD  Sonographer Comments: Suboptimal apical window. IMPRESSIONS  1. Left ventricular ejection fraction, by estimation, is 65 to 70%. The left ventricle has normal function. The left ventricle has no regional wall motion abnormalities. Left ventricular diastolic parameters are consistent with Grade I diastolic dysfunction (impaired relaxation).  2. Right ventricular systolic function is normal. The right ventricular size is normal.  3. The mitral valve is normal in structure. Trivial mitral valve regurgitation.  4. The aortic valve was not well visualized. Aortic valve regurgitation is not visualized. FINDINGS  Left Ventricle: Left ventricular ejection fraction, by estimation, is 65 to 70%. The left ventricle has normal function. The left ventricle has no regional wall motion abnormalities. The left ventricular internal cavity size was normal in size. There is  no left  ventricular hypertrophy. Left ventricular diastolic parameters are consistent with Grade I diastolic dysfunction (impaired relaxation). Right Ventricle: The right ventricular size is normal. No increase in right ventricular wall thickness. Right ventricular systolic function is normal. Left Atrium: Left atrial size was normal in size. Right Atrium: Right atrial size was normal in size. Pericardium: There is no evidence of pericardial effusion. Mitral Valve: The mitral valve is normal in structure. Trivial mitral valve regurgitation. Tricuspid Valve: The tricuspid valve is not well visualized. Tricuspid valve regurgitation is not demonstrated. Aortic Valve: The aortic valve was not well visualized. Aortic valve regurgitation is not visualized. Aortic valve mean gradient measures 5.0 mmHg. Aortic valve peak gradient measures 9.6 mmHg. Aortic valve area, by VTI measures 2.71 cm. Pulmonic Valve: The pulmonic valve was not well visualized. Pulmonic valve regurgitation is not visualized. Aorta: The aortic root is normal in size and structure. Venous: The inferior vena cava was not well visualized. IAS/Shunts: No atrial level shunt detected by color flow Doppler.  LEFT VENTRICLE PLAX 2D LVIDd:         4.18 cm  Diastology LVIDs:         2.29 cm  LV e' medial:    6.20 cm/s LV PW:         0.91 cm  LV E/e' medial:  11.5 LV IVS:        0.78 cm  LV e' lateral:   9.25 cm/s LVOT diam:     2.00 cm  LV E/e' lateral: 7.7 LV SV:         88 LV SV Index:   42 LVOT Area:     3.14 cm  RIGHT VENTRICLE RV S prime:     13.50 cm/s TAPSE (M-mode): 4.2 cm LEFT ATRIUM             Index       RIGHT  ATRIUM           Index LA diam:        2.50 cm 1.21 cm/m  RA Area:     10.90 cm LA Vol (A2C):   20.8 ml 10.07 ml/m RA Volume:   21.40 ml  10.36 ml/m LA Vol (A4C):   24.0 ml 11.62 ml/m LA Biplane Vol: 22.7 ml 10.99 ml/m  AORTIC VALVE                    PULMONIC VALVE AV Area (Vmax):    2.66 cm     PV Vmax:        0.81 m/s AV Area (Vmean):    2.77 cm     PV Peak grad:   2.6 mmHg AV Area (VTI):     2.71 cm     RVOT Peak grad: 2 mmHg AV Vmax:           155.00 cm/s AV Vmean:          108.000 cm/s AV VTI:            0.323 m AV Peak Grad:      9.6 mmHg AV Mean Grad:      5.0 mmHg LVOT Vmax:         131.00 cm/s LVOT Vmean:        95.200 cm/s LVOT VTI:          0.279 m LVOT/AV VTI ratio: 0.86  AORTA Ao Root diam: 3.47 cm MITRAL VALVE                TRICUSPID VALVE MV Area (PHT): 3.99 cm     TR Peak grad:   12.4 mmHg MV Decel Time: 190 msec     TR Vmax:        176.00 cm/s MV E velocity: 71.10 cm/s MV A velocity: 106.00 cm/s  SHUNTS MV E/A ratio:  0.67         Systemic VTI:  0.28 m                             Systemic Diam: 2.00 cm Debbe OdeaBrian Agbor-Etang MD Electronically signed by Debbe OdeaBrian Agbor-Etang MD Signature Date/Time: 06/08/2020/1:45:07 PM    Final    CT CHEST ABDOMEN PELVIS WO CONTRAST  Result Date: 06/06/2020 CLINICAL DATA:  Found apneic. EXAM: CT CHEST, ABDOMEN AND PELVIS WITHOUT CONTRAST TECHNIQUE: Multidetector CT imaging of the chest, abdomen and pelvis was performed following the standard protocol without IV contrast. COMPARISON:  March 13, 2019 FINDINGS: CT CHEST FINDINGS Cardiovascular: There is mild calcification of the aortic arch. Normal heart size with mild coronary artery calcification. No pericardial effusion. Mediastinum/Nodes: Endotracheal and nasogastric tubes are in place. No enlarged mediastinal, hilar, or axillary lymph nodes. Thyroid gland, trachea, and esophagus demonstrate no significant findings. Lungs/Pleura: Moderate severity areas of scarring, atelectasis and/or early infiltrate are seen within the posterior aspects of the bilateral upper lobes and bilateral lower lobes. There is no evidence of a pleural effusion or pneumothorax. Musculoskeletal: Acute nondisplaced anterior fourth left rib fracture is seen (axial CT image 34 through 37, CT series number 2). Degenerative changes are noted throughout the thoracic spine. CT  ABDOMEN PELVIS FINDINGS Hepatobiliary: No focal liver abnormality is seen. No gallstones, gallbladder wall thickening, or biliary dilatation. Pancreas: Unremarkable. No pancreatic ductal dilatation or surrounding inflammatory changes. Spleen: Normal in size without focal abnormality. Adrenals/Urinary Tract: A stable 2.0 cm  x 2.0 cm low-attenuation right adrenal mass is seen. Stable 1.1 cm x 0.8 cm and 1.3 cm x 1.0 cm low-attenuation left adrenal masses are also noted. Kidneys are normal, without renal calculi, focal lesion, or hydronephrosis. A Foley catheter is seen within the urinary bladder. Stomach/Bowel: Stomach is within normal limits. Appendix appears normal. No evidence of bowel wall thickening, distention, or inflammatory changes. Noninflamed diverticula are seen throughout the sigmoid colon. Vascular/Lymphatic: Aortic atherosclerosis. No enlarged abdominal or pelvic lymph nodes. Reproductive: Prostate is unremarkable. Other: No abdominal wall hernia or abnormality. No abdominopelvic ascites. Musculoskeletal: Degenerative changes seen throughout the lumbar spine. IMPRESSION: 1. Moderate severity bilateral upper lobe and bilateral lower lobe scarring, atelectasis and/or early infiltrate. 2. 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 3. Sigmoid diverticulosis. Electronically Signed   By: Aram Candela M.D.   On: 06/06/2020 16:26    Cardiac Studies   TTEcho 06/08/2020 1. Left ventricular ejection fraction, by estimation, is 65 to 70%. The  left ventricle has normal function. The left ventricle has no regional  wall motion abnormalities. Left ventricular diastolic parameters are  consistent with Grade I diastolic  dysfunction (impaired relaxation).  2. Right ventricular systolic function is normal. The right ventricular  size is normal.  3. The mitral valve is normal in structure. Trivial mitral valve  regurgitation.  4. The aortic valve was not well visualized. Aortic valve regurgitation   is not visualized.   Patient Profile     65 y.o. male with history of hyperlipidemia, smoker x40 years presenting with altered mental status, intubated for airway protection.  Being seen for elevated troponins.  Assessment & Plan    1.  Elevated troponins, coronary calcifications noted on chest CT -Troponins peaked at 291. -Currently denies chest pain -Aspirin 81 mg, Lipitor -Echo with preserved ejection fraction -Plan for Lexiscan Myoview in the morning.  2.  Hyperlipidemia -Lipitor  3.  Current smoker -Cessation advised  4.  Altered mental status, requiring intubation -Successfully extubated, etiology remains unclear -Management as per critical care team  Total encounter time 35 minutes  Greater than 50% was spent in counseling and coordination of care with the patient and family, daughter at bedside.       Signed, Debbe Odea, MD  06/08/2020, 1:49 PM

## 2020-06-08 NOTE — Progress Notes (Addendum)
[facility]  - [place] at [facility]   PATIENT NAME: James Porter    MR#:  [id number]  DATE OF BIRTH:  1955-04-03  SUBJECTIVE:  denies any respiratory distress. Feeling hungry. No chest pain. She was reported by RN  REVIEW OF SYSTEMS:   Review of Systems  Constitutional: Negative for chills, fever and weight loss.  HENT: Negative for ear discharge, ear pain and nosebleeds.   Eyes: Negative for blurred vision, pain and discharge.  Respiratory: Negative for sputum production, shortness of breath, wheezing and stridor.   Cardiovascular: Negative for chest pain, palpitations, orthopnea and PND.  Gastrointestinal: Negative for abdominal pain, diarrhea, nausea and vomiting.  Genitourinary: Negative for frequency and urgency.  Musculoskeletal: Negative for back pain and joint pain.  Neurological: Negative for sensory change, speech change, focal weakness and weakness.  Psychiatric/Behavioral: Negative for depression and hallucinations. The patient is not nervous/anxious.    Tolerating Diet:yes Tolerating PT:   DRUG ALLERGIES:  No Known Allergies  VITALS:  Blood pressure 134/84, pulse 74, temperature 98.5 F (36.9 C), resp. rate 19, height 5' 9.02" (1.753 m), weight 90.7 kg, SpO2 91 %.  PHYSICAL EXAMINATION:   Physical Exam  GENERAL:  65 y.o.-year-old patient lying in the bed with no acute distress.  HEENT: Head atraumatic, normocephalic. Oropharynx and nasopharynx clear.  LUNGS: Normal breath sounds bilaterally, no wheezing, rales, rhonchi. No use of accessory muscles of respiration.  CARDIOVASCULAR: S1, S2 normal. No murmurs, rubs, or gallops.  ABDOMEN: Soft, nontender, nondistended. Bowel sounds present. No organomegaly or mass.  EXTREMITIES: No cyanosis, clubbing or edema b/l.    NEUROLOGIC: Cranial nerves II through XII are intact. No focal Motor or sensory deficits b/l.   PSYCHIATRIC:  patient is alert and oriented x 3.  SKIN: No obvious rash, lesion, or  ulcer.   LABORATORY PANEL:  CBC Recent Labs  Lab 06/08/20 0413  WBC 8.9  HGB 13.7  HCT 39.5  PLT 227    Chemistries  Recent Labs  Lab 06/06/20 1500 06/07/20 0453 06/08/20 0413  NA 139   < > 137  K 3.9   < > 3.7  CL 108   < > 104  CO2 24   < > 25  GLUCOSE 152*   < > 83  BUN 18   < > 15  CREATININE 1.06   < > 0.78  CALCIUM 8.2*   < > 8.5*  MG  --    < > 1.8  AST 36  --   --   ALT 39  --   --   ALKPHOS 62  --   --   BILITOT 0.7  --   --    < > = values in this interval not displayed.   Cardiac Enzymes No results for input(s): TROPONINI in the last 168 hours. RADIOLOGY:  CT Head Wo Contrast  Result Date: 06/06/2020 CLINICAL DATA:  Altered mental status. Found unresponsive and apneic. EXAM: CT HEAD WITHOUT CONTRAST CT CERVICAL SPINE WITHOUT CONTRAST TECHNIQUE: Multidetector CT imaging of the head and cervical spine was performed following the standard protocol without intravenous contrast. Multiplanar CT image reconstructions of the cervical spine were also generated. COMPARISON:  None. FINDINGS: CT HEAD FINDINGS Brain: There is no evidence of an acute infarct, intracranial hemorrhage, mass, midline shift, or extra-axial fluid collection. The ventricles and sulci are normal. Vascular: Calcified atherosclerosis at the skull base. No hyperdense vessel. Skull: No fracture or suspicious osseous lesion. Sinuses/Orbits: Moderate bilateral ethmoid sinus mucosal  thickening. Clear mastoid air cells. Unremarkable included orbits. Other: None. CT CERVICAL SPINE FINDINGS Alignment: Mild reversal of the normal cervical lordosis. Trace anterolisthesis of C3 on C4 and trace retrolisthesis of C5 on C6 and C6 on C7. Skull base and vertebrae: No acute fracture or suspicious osseous lesion. Soft tissues and spinal canal: No prevertebral fluid or swelling. No visible canal hematoma. Disc levels: Moderate disc degeneration at C5-6 and C6-7 and mild degeneration elsewhere. Advanced facet arthrosis on the  right at C2-3 and on the left at C3-4. Moderate to severe neural foraminal stenosis on the left at C3-4, on the right at C5-6, and bilaterally at C6-7 and C7-T1 due to uncovertebral and facet spurring. At least mild spinal stenosis at C6-7. Upper chest: Reported separately. Other: Partially visualized endotracheal and enteric tubes. Mild calcific atherosclerosis at the carotid bifurcations. IMPRESSION: 1. No evidence of acute intracranial abnormality. 2. No acute cervical spine fracture. 3. 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 Electronically Signed   By: Sebastian Ache M.D.   On: 06/06/2020 16:23   CT Cervical Spine Wo Contrast  Result Date: 06/06/2020 CLINICAL DATA:  Altered mental status. Found unresponsive and apneic. EXAM: CT HEAD WITHOUT CONTRAST CT CERVICAL SPINE WITHOUT CONTRAST TECHNIQUE: Multidetector CT imaging of the head and cervical spine was performed following the standard protocol without intravenous contrast. Multiplanar CT image reconstructions of the cervical spine were also generated. COMPARISON:  None. FINDINGS: CT HEAD FINDINGS Brain: There is no evidence of an acute infarct, intracranial hemorrhage, mass, midline shift, or extra-axial fluid collection. The ventricles and sulci are normal. Vascular: Calcified atherosclerosis at the skull base. No hyperdense vessel. Skull: No fracture or suspicious osseous lesion. Sinuses/Orbits: Moderate bilateral ethmoid sinus mucosal thickening. Clear mastoid air cells. Unremarkable included orbits. Other: None. CT CERVICAL SPINE FINDINGS Alignment: Mild reversal of the normal cervical lordosis. Trace anterolisthesis of C3 on C4 and trace retrolisthesis of C5 on C6 and C6 on C7. Skull base and vertebrae: No acute fracture or suspicious osseous lesion. Soft tissues and spinal canal: No prevertebral fluid or swelling. No visible canal hematoma. Disc levels: Moderate disc degeneration at C5-6 and  C6-7 and mild degeneration elsewhere. Advanced facet arthrosis on the right at C2-3 and on the left at C3-4. Moderate to severe neural foraminal stenosis on the left at C3-4, on the right at C5-6, and bilaterally at C6-7 and C7-T1 due to uncovertebral and facet spurring. At least mild spinal stenosis at C6-7. Upper chest: Reported separately. Other: Partially visualized endotracheal and enteric tubes. Mild calcific atherosclerosis at the carotid bifurcations. IMPRESSION: 1. No evidence of acute intracranial abnormality. 2. No acute cervical spine fracture. 3. 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 Electronically Signed   By: Sebastian Ache M.D.   On: 06/06/2020 16:23   DG Chest Port 1 View  Result Date: 06/07/2020 CLINICAL DATA:  Acute respiratory failure EXAM: PORTABLE CHEST 1 VIEW COMPARISON:  06/06/2020 FINDINGS: Endotracheal tube seen 3.4 cm above the carina. Nasogastric tube extends into the upper gastric fundus. Lungs are clear. No pneumothorax or pleural effusion. Cardiac size within normal limits. Pulmonary vascularity is normal. No acute bone abnormality. IMPRESSION: Support tubes in appropriate position. No focal pulmonary infiltrate. Electronically Signed   By: Helyn Numbers MD   On: 06/07/2020 05:10   DG Chest Port 1 View  Result Date: 06/06/2020 CLINICAL DATA:  Status post intubation EXAM: PORTABLE CHEST 1 VIEW COMPARISON:  Film from earlier in the same day. FINDINGS: Endotracheal tube and gastric catheter are noted in satisfactory position and stable. Lungs are clear. No bony abnormality is noted. IMPRESSION: Tubes and lines as described above stable in appearance. No other focal abnormality is noted. Electronically Signed   By: Alcide CleverMark  Lukens M.D.   On: 06/06/2020 20:00   DG Chest Portable 1 View  Result Date: 06/06/2020 CLINICAL DATA:  Tube placement EXAM: PORTABLE CHEST 1 VIEW COMPARISON:  11/19/2009 FINDINGS: Endotracheal tube tip  is about 2.7 cm superior to carina. Esophageal tube tip beneath the left hemidiaphragm. Right lung is grossly clear. Patchy atelectasis or minimal infiltrate in the left perihilar region. Normal heart size. No pneumothorax IMPRESSION: 1. Endotracheal tube tip about 2.7 cm superior to carina 2. Patchy atelectasis or minimal infiltrate in the left perihilar region Electronically Signed   By: Jasmine PangKim  Fujinaga M.D.   On: 06/06/2020 15:48   EEG adult  Result Date: 06/07/2020 Charlsie QuestYadav, Priyanka O, MD     06/07/2020  3:28 PM Patient Name: James Nevinerrance W Kertesz MRN: [id number] Epilepsy Attending: Charlsie QuestPriyanka O Yadav Referring Physician/Provider: Harlon DittyJeremiah Keene, NP Date: 06/07/2020 Duration: 25.12 mins Patient history: 65 year old male with altered mental status.  EEG to evaluate for seizures. Level of alertness:  lethargic AEDs during EEG study: None Technical aspects: This EEG study was done with scalp electrodes positioned according to the 10-20 International system of electrode placement. Electrical activity was acquired at a sampling rate of 500Hz  and reviewed with a high frequency filter of 70Hz  and a low frequency filter of 1Hz . EEG data were recorded continuously and digitally stored. Description: The posterior dominant rhythm consists of 7 Hz activity of moderate voltage (25-35 uV) seen predominantly in posterior head regions, symmetric and reactive to eye opening and eye closing. EEG showed continuous generalized 5 to 7 Hz theta as well as intermittent generalized 2 to 3 Hz delta slowing. Physiologic photic driving was not seen during photic stimulation. Hyperventilation was not performed.   ABNORMALITY - Continuous slow, generalized - Background slow IMPRESSION: This study is suggestive of mild to moderate diffuse encephalopathy, nonspecific etiology. No seizures or epileptiform discharges were seen throughout the recording. Charlsie Questriyanka O Yadav   ECHOCARDIOGRAM COMPLETE  Result Date: 06/08/2020    ECHOCARDIOGRAM REPORT    [facility]   [facility] Date of Exam: 06/08/2020 Medical Rec #:  [id number]          Height:       69.0 in Accession #:    [id number]         Weight:       200.0 lb Date of Birth:  11/29/1955           BSA:          2.066 m Patient Age:    64 years           BP:           134/83 mmHg Patient Gender: M                  HR:           76 bpm. Exam Location:  [facility] Procedure: 2D Echo, Color Doppler and Cardiac Doppler Indications:     NSTEMI I21.4  History:         Patient has no prior history of Echocardiogram examinations.                  CAD; Risk Factors:Hypertension.  Sonographer:  Cristela Blue RDCS (AE) Referring Phys:  [id number] Sondra Barges Diagnosing Phys: Debbe Odea MD  Sonographer Comments: Suboptimal apical window. IMPRESSIONS  1. Left ventricular ejection fraction, by estimation, is 65 to 70%. The left ventricle has normal function. The left ventricle has no regional wall motion abnormalities. Left ventricular diastolic parameters are consistent with Grade I diastolic dysfunction (impaired relaxation).  2. Right ventricular systolic function is normal. The right ventricular size is normal.  3. The mitral valve is normal in structure. Trivial mitral valve regurgitation.  4. The aortic valve was not well visualized. Aortic valve regurgitation is not visualized. FINDINGS  Left Ventricle: Left ventricular ejection fraction, by estimation, is 65 to 70%. The left ventricle has normal function. The left ventricle has no regional wall motion abnormalities. The left ventricular internal cavity size was normal in size. There is  no left ventricular hypertrophy. Left ventricular diastolic parameters are consistent with Grade I diastolic dysfunction (impaired relaxation). Right Ventricle: The right ventricular size is normal. No increase in right ventricular wall thickness. Right ventricular systolic function is normal. Left Atrium: Left atrial size was normal in size. Right Atrium: Right atrial size was  normal in size. Pericardium: There is no evidence of pericardial effusion. Mitral Valve: The mitral valve is normal in structure. Trivial mitral valve regurgitation. Tricuspid Valve: The tricuspid valve is not well visualized. Tricuspid valve regurgitation is not demonstrated. Aortic Valve: The aortic valve was not well visualized. Aortic valve regurgitation is not visualized. Aortic valve mean gradient measures 5.0 mmHg. Aortic valve peak gradient measures 9.6 mmHg. Aortic valve area, by VTI measures 2.71 cm. Pulmonic Valve: The pulmonic valve was not well visualized. Pulmonic valve regurgitation is not visualized. Aorta: The aortic root is normal in size and structure. Venous: The inferior vena cava was not well visualized. IAS/Shunts: No atrial level shunt detected by color flow Doppler.  LEFT VENTRICLE PLAX 2D LVIDd:         4.18 cm  Diastology LVIDs:         2.29 cm  LV e' medial:    6.20 cm/s LV PW:         0.91 cm  LV E/e' medial:  11.5 LV IVS:        0.78 cm  LV e' lateral:   9.25 cm/s LVOT diam:     2.00 cm  LV E/e' lateral: 7.7 LV SV:         88 LV SV Index:   42 LVOT Area:     3.14 cm  RIGHT VENTRICLE RV S prime:     13.50 cm/s TAPSE (M-mode): 4.2 cm LEFT ATRIUM             Index       RIGHT ATRIUM           Index LA diam:        2.50 cm 1.21 cm/m  RA Area:     10.90 cm LA Vol (A2C):   20.8 ml 10.07 ml/m RA Volume:   21.40 ml  10.36 ml/m LA Vol (A4C):   24.0 ml 11.62 ml/m LA Biplane Vol: 22.7 ml 10.99 ml/m  AORTIC VALVE                    PULMONIC VALVE AV Area (Vmax):    2.66 cm     PV Vmax:        0.81 m/s AV Area (Vmean):   2.77 cm     PV Peak  grad:   2.6 mmHg AV Area (VTI):     2.71 cm     RVOT Peak grad: 2 mmHg AV Vmax:           155.00 cm/s AV Vmean:          108.000 cm/s AV VTI:            0.323 m AV Peak Grad:      9.6 mmHg AV Mean Grad:      5.0 mmHg LVOT Vmax:         131.00 cm/s LVOT Vmean:        95.200 cm/s LVOT VTI:          0.279 m LVOT/AV VTI ratio: 0.86  AORTA Ao Root diam:  3.47 cm MITRAL VALVE                TRICUSPID VALVE MV Area (PHT): 3.99 cm     TR Peak grad:   12.4 mmHg MV Decel Time: 190 msec     TR Vmax:        176.00 cm/s MV E velocity: 71.10 cm/s MV A velocity: 106.00 cm/s  SHUNTS MV E/A ratio:  0.67         Systemic VTI:  0.28 m                             Systemic Diam: 2.00 cm Debbe Odea MD Electronically signed by Debbe Odea MD Signature Date/Time: 06/08/2020/1:45:07 PM    Final    CT CHEST ABDOMEN PELVIS WO CONTRAST  Result Date: 06/06/2020 CLINICAL DATA:  Found apneic. EXAM: CT CHEST, ABDOMEN AND PELVIS WITHOUT CONTRAST TECHNIQUE: Multidetector CT imaging of the chest, abdomen and pelvis was performed following the standard protocol without IV contrast. COMPARISON:  March 13, 2019 FINDINGS: CT CHEST FINDINGS Cardiovascular: There is mild calcification of the aortic arch. Normal heart size with mild coronary artery calcification. No pericardial effusion. Mediastinum/Nodes: Endotracheal and nasogastric tubes are in place. No enlarged mediastinal, hilar, or axillary lymph nodes. Thyroid gland, trachea, and esophagus demonstrate no significant findings. Lungs/Pleura: Moderate severity areas of scarring, atelectasis and/or early infiltrate are seen within the posterior aspects of the bilateral upper lobes and bilateral lower lobes. There is no evidence of a pleural effusion or pneumothorax. Musculoskeletal: Acute nondisplaced anterior fourth left rib fracture is seen (axial CT image 34 through 37, CT series number 2). Degenerative changes are noted throughout the thoracic spine. CT ABDOMEN PELVIS FINDINGS Hepatobiliary: No focal liver abnormality is seen. No gallstones, gallbladder wall thickening, or biliary dilatation. Pancreas: Unremarkable. No pancreatic ductal dilatation or surrounding inflammatory changes. Spleen: Normal in size without focal abnormality. Adrenals/Urinary Tract: A stable 2.0 cm x 2.0 cm low-attenuation right adrenal mass is seen.  Stable 1.1 cm x 0.8 cm and 1.3 cm x 1.0 cm low-attenuation left adrenal masses are also noted. Kidneys are normal, without renal calculi, focal lesion, or hydronephrosis. A Foley catheter is seen within the urinary bladder. Stomach/Bowel: Stomach is within normal limits. Appendix appears normal. No evidence of bowel wall thickening, distention, or inflammatory changes. Noninflamed diverticula are seen throughout the sigmoid colon. Vascular/Lymphatic: Aortic atherosclerosis. No enlarged abdominal or pelvic lymph nodes. Reproductive: Prostate is unremarkable. Other: No abdominal wall hernia or abnormality. No abdominopelvic ascites. Musculoskeletal: Degenerative changes seen throughout the lumbar spine. IMPRESSION: 1. Moderate severity bilateral upper lobe and bilateral lower lobe scarring, atelectasis and/or early infiltrate. 2. 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  3. Sigmoid diverticulosis. Electronically Signed   By: Aram Candela M.D.   On: 06/06/2020 16:26   ASSESSMENT AND PLAN:  65 y.o. Male admitted with altered mental status, exact etiology unclear at this time requiring intubation and mechanical ventilation for airway protection.  Concern for aspiration pneumonia.Per notes and wife,  the patient was at home watching TV and suddenly became unresponsive, apneic, and cyanotic.  Bystander CPR was provided by his wife for approximately 20 minutes. However when EMS arrived there was a pulse. According to family they do not suspect that he intentionally overdosed on his or his wife's medications.  Altered mental status/acute metabolic encephalopathy resolved suspected aspiration pneumonia/SIRS -- patient presented to the emergency room after CPR was initiated by wife at home with certain ethnic spell and unresponsiveness. Patient was intubated for airway protection now extubated on room air sats more than 92% --SIRS improved -- change to oral antibiotic -- RN to do swallow eval and start  patient on heart healthy diet -EEG negative -CT head no acute findings  Mildly elevated troponin suspect demand ischemia -- no chest pain. --myoview stress test 5/19--per cardiology  Anemia normocytic -- monitor hemoglobin.  Hyperlipidemia  --continue statins  History of hypertension -- resume lisinopril  Procedures: Family communication :d/w wife on the phone Consults : CODE STATUS: FULL DVT Prophylaxis :lovenox Level of care: Med-Surg Status is: Inpatient  Remains inpatient appropriate because:Inpatient level of care appropriate due to severity of illness   Dispo: The patient is from: Home              Anticipated d/c is to: Home              Patient currently is not medically stable to d/c.   Difficult to place patient No  Transfer patient out of ICU. Will have physical therapy see patient. If continues to show improvement likely discharge tomorrow. Patient and wife in agreement.      TOTAL TIME TAKING CARE OF THIS PATIENT: 30 minutes.  >50% time spent on counselling and coordination of care  Note: This dictation was prepared with Dragon dictation along with smaller phrase technology. Any transcriptional errors that result from this process are unintentional.  Enedina Finner M.D    [facility]   CC: Primary care physician; Evie Lacks, NP (Inactive)Patient ID: James Porter, male   DOB: Apr 07, 1955, 65 y.o.   MRN: [id number]

## 2020-06-09 ENCOUNTER — Other Ambulatory Visit: Payer: Medicare Other

## 2020-06-09 ENCOUNTER — Inpatient Hospital Stay (HOSPITAL_COMMUNITY)
Admit: 2020-06-09 | Discharge: 2020-06-09 | Disposition: A | Discharge status: Home / Self Care | Payer: Medicare Other | Attending: Medical | Admitting: Medical

## 2020-06-09 ENCOUNTER — Telehealth: Payer: Self-pay | Admitting: *Deleted

## 2020-06-09 DIAGNOSIS — R55 Syncope and collapse: Secondary | ICD-10-CM

## 2020-06-09 DIAGNOSIS — R778 Other specified abnormalities of plasma proteins: Secondary | ICD-10-CM

## 2020-06-09 DIAGNOSIS — I2584 Coronary atherosclerosis due to calcified coronary lesion: Secondary | ICD-10-CM

## 2020-06-09 DIAGNOSIS — J9601 Acute respiratory failure with hypoxia: Secondary | ICD-10-CM | POA: Diagnosis not present

## 2020-06-09 DIAGNOSIS — I10 Essential (primary) hypertension: Secondary | ICD-10-CM | POA: Diagnosis not present

## 2020-06-09 DIAGNOSIS — J690 Pneumonitis due to inhalation of food and vomit: Secondary | ICD-10-CM | POA: Diagnosis not present

## 2020-06-09 DIAGNOSIS — I2510 Atherosclerotic heart disease of native coronary artery without angina pectoris: Secondary | ICD-10-CM

## 2020-06-09 LAB — GLUCOSE, CAPILLARY
Glucose-Capillary: 94 mg/dL (ref 70–99)
Glucose-Capillary: 98 mg/dL (ref 70–99)
Glucose-Capillary: 99 mg/dL (ref 70–99)
Glucose-Capillary: 188 mg/dL — ABNORMAL HIGH (ref 70–99)
Glucose-Capillary: 207 mg/dL — ABNORMAL HIGH (ref 70–99)

## 2020-06-09 MED ORDER — AMOXICILLIN-POT CLAVULANATE 875-125 MG PO TABS: 1.0000 | ORAL_TABLET | Freq: Two times a day (BID) | ORAL | 0 refills | Status: AC

## 2020-06-09 MED ORDER — COVID-19 MRNA VACC (MODERNA) 50 MCG/0.25ML IM SUSP
0.2500 mL | Freq: Once | INTRAMUSCULAR | Status: AC
  Administered 2020-06-09: 0.25 mL via INTRAMUSCULAR
  Filled 2020-06-09: qty 0.25

## 2020-06-09 NOTE — Telephone Encounter (Signed)
Noted- the patient was seen by Eula Listen, PA/ Dr. Azucena Cecil Dr. Mariah Milling  The test was mentioned in Dr. Merita Norton note on 06/08/20. I am unsure if he or Dr. Mariah Milling discussed this in detail with the patient.  Will forward to both MD's to confirm which can sign the attestation order.

## 2020-06-09 NOTE — Plan of Care (Signed)
Neuro: stable at baseline, interactive and appropriate Resp: stable on room air CV: afebrile, vital signs stable, no complaints of chest pain, cardiotech placed Zio monitor on patient, performed teaching, patient expresses understanding and will refresh his knowledge using the literature provided. GIGU: tolerating PO well, voiding in urinal, Last BM prior to admission Skin: clean dry and intact Social: Patient updating family via room telephone, all questions and concerns addressed. All belongings sent home with patient.  Events: Discharged home, son is transporting him home, volunteer took patient down in a wheelchair.  Problem: Education: Goal: Knowledge of General Education information will improve Description: Including pain rating scale, medication(s)/side effects and non-pharmacologic comfort measures Outcome: Adequate for Discharge   Problem: Health Behavior/Discharge Planning: Goal: Ability to manage health-related needs will improve Outcome: Adequate for Discharge   Problem: Clinical Measurements: Goal: Ability to maintain clinical measurements within normal limits will improve Outcome: Adequate for Discharge Goal: Will remain free from infection Outcome: Adequate for Discharge Goal: Diagnostic test results will improve Outcome: Adequate for Discharge Goal: Respiratory complications will improve Outcome: Adequate for Discharge Goal: Cardiovascular complication will be avoided Outcome: Adequate for Discharge   Problem: Activity: Goal: Risk for activity intolerance will decrease Outcome: Adequate for Discharge   Problem: Nutrition: Goal: Adequate nutrition will be maintained Outcome: Adequate for Discharge   Problem: Coping: Goal: Level of anxiety will decrease Outcome: Adequate for Discharge   Problem: Elimination: Goal: Will not experience complications related to bowel motility Outcome: Adequate for Discharge Goal: Will not experience complications related to  urinary retention Outcome: Adequate for Discharge   Problem: Pain Managment: Goal: General experience of comfort will improve Outcome: Adequate for Discharge   Problem: Safety: Goal: Ability to remain free from injury will improve Outcome: Adequate for Discharge   Problem: Skin Integrity: Goal: Risk for impaired skin integrity will decrease Outcome: Adequate for Discharge

## 2020-06-09 NOTE — Telephone Encounter (Signed)
Attestation should be signed by the MD/APP who spoke with the patient about the test and placed the order.  It would be inappropriate for me to attest to the order without having spoken to the patient myself about it.  When I saw him earlier this week, he was still intubated and could not provide informed consent.  Yvonne Kendall, MD [facility]

## 2020-06-09 NOTE — Telephone Encounter (Signed)
-----   Message from Antonieta Iba, MD sent at 06/09/2020 11:25 AM EDT ----- Leaving the hospital May 19 Could not stay to get his stress test done inpatient Can we arrange outpatient Myoview at his convenience Had syncope, elevated troponin Then f/u Dr. Okey Dupre Thx TG

## 2020-06-09 NOTE — Telephone Encounter (Signed)
Dr. Azucena Cecil has signed the attestation order.  Await scheduling to touch base with the patient for an appointment after 06/23/20.

## 2020-06-09 NOTE — Telephone Encounter (Signed)
Scheduled pt to see Dr. Okey Dupre on 6/30 at 11am.  Called and left message to call back.

## 2020-06-09 NOTE — Discharge Summary (Signed)
[facility] [place] [facility]   PATIENT NAME: James Porter    MR#:  [id number]  DATE OF BIRTH:  [birth year]  DATE OF ADMISSION:  06/06/2020 ADMITTING PHYSICIAN: No admitting provider for patient encounter.  DATE OF DISCHARGE: 5/  PRIMARY CARE PHYSICIAN: Pointer, Elizabeth, NP (Inactive)    ADMISSION DIAGNOSIS:  Trauma [T14.90XA] Altered mental status, unspecified [R41.82]  DISCHARGE DIAGNOSIS:  acute metabolic encephalopathy/? Syncope-- resolved aspiration pneumonia  SECONDARY DIAGNOSIS:   Past Medical History:  Diagnosis Date  . Arthritis   . CAD (coronary artery disease)   . Deep vein thrombosis (DVT) (HCC)   . HTN (hypertension)   . Hyperlipidemia   . Smoker     HOSPITAL COURSE:  64 y.o. Male admitted withaltered mental status,exact etiology unclear at this time requiring intubation and mechanical ventilation for airway protection. Concern for aspiration pneumonia.Per notesand wife,the patient was at home watching TV and suddenly becameunresponsive, apneic, and cyanotic. Bystander CPR was providedby his wife for approximately 20 minutes. However when EMS arrived there was a pulse.According to family they do not suspect that he intentionally overdosed on his or his wife's medications.  Altered mental status/acute metabolic encephalopathy resolved suspected aspiration pneumonia/SIRS -- patient presented to the emergency room after CPR was initiated by wife at home with apneid spell and unresponsiveness. Patient was intubated for airway protection now extubated on room air sats more than 92% --?syncope with possible arrhythmia although none seen on Telemetry here. Dr Mariah Milling recommends zio monitor at discharge. --SIRS improved -- change to oral antibiotic -EEG negative -CT head no acute findings  Mildly elevated troponin suspect demand ischemia. --myoview stress test 5/19--per cardiology. Patient drink coffee. Patient will follow-up  with cardiology Dr. Mariah Milling as outpatient and consider stress test then. He does not want to wait another day to get it done.Zio wanted her to be placed prior to discharge. Patient does not have any chest pain.  Anemia normocytic --  stable  Hyperlipidemia  --continue statins  History of hypertension -- resume lisinopril  Procedures: Family communication :d/w wife on the phone today and also discussed discharge plan Consults : cardiology, P CCM CODE STATUS: FULL DVT Prophylaxis :lovenox Level of care: Med-Surg Status is: Inpatient   Dispo: The patient is from: Home  Anticipated d/c is to: Home  Patient currently is medically optimized for discharge              Difficult to place patient No  patient agreeable. Plan discussed with Dr. Mariah Milling.  CONSULTS OBTAINED:  Treatment Team:  Antonieta Iba, MD  DRUG ALLERGIES:  No Known Allergies  DISCHARGE MEDICATIONS:   Allergies as of 06/09/2020   No Known Allergies     Medication List    TAKE these medications   acetaminophen 650 MG CR tablet Commonly known as: TYLENOL Take 650 mg by mouth every 8 (eight) hours as needed for pain.   amoxicillin-clavulanate 875-125 MG tablet Commonly known as: AUGMENTIN Take 1 tablet by mouth every 12 (twelve) hours for 3 days.   atorvastatin 40 MG tablet Commonly known as: LIPITOR Take 40 mg by mouth at bedtime.   cyclobenzaprine 10 MG tablet Commonly known as: FLEXERIL Take 10 mg by mouth 2 (two) times daily as needed for muscle spasms.   escitalopram 20 MG tablet Commonly known as: LEXAPRO Take 20 mg by mouth daily.   famotidine 20 MG tablet Commonly known as: PEPCID Take 20 mg by mouth 2 (two) times daily.   gabapentin 300  MG capsule Commonly known as: NEURONTIN Take 600 mg by mouth 3 (three) times daily.   lisinopril 10 MG tablet Commonly known as: ZESTRIL Take 10 mg by mouth daily.   meloxicam 15 MG tablet Commonly known as:  MOBIC Take 15 mg by mouth daily.   timolol 0.5 % ophthalmic solution Commonly known as: TIMOPTIC Place 1 drop into both eyes 2 (two) times daily.       If you experience worsening of your admission symptoms, develop shortness of breath, life threatening emergency, suicidal or homicidal thoughts you must seek medical attention immediately by calling 911 or calling your MD immediately  if symptoms less severe.  You Must read complete instructions/literature along with all the possible adverse reactions/side effects for all the Medicines you take and that have been prescribed to you. Take any new Medicines after you have completely understood and accept all the possible adverse reactions/side effects.   Please note  You were cared for by a hospitalist during your hospital stay. If you have any questions about your discharge medications or the care you received while you were in the hospital after you are discharged, you can call the unit and asked to speak with the hospitalist on call if the hospitalist that took care of you is not available. Once you are discharged, your primary care physician will handle any further medical issues. Please note that NO REFILLS for any discharge medications will be authorized once you are discharged, as it is imperative that you return to your primary care physician (or establish a relationship with a primary care physician if you do not have one) for your aftercare needs so that they can reassess your need for medications and monitor your lab values. Today   SUBJECTIVE  I am doing well. Able to ambulate in the room by himself   VITAL SIGNS:  Blood pressure (!) 147/80, pulse (!) 58, temperature 98.7 F (37.1 C), temperature source Oral, resp. rate 18, height 5' 9.02" (1.753 m), weight 91 kg, SpO2 95 %.  I/O:    Intake/Output Summary (Last 24 hours) at 06/09/2020 1021 Last data filed at 06/09/2020 0800 Gross per 24 hour  Intake 101.83 ml  Output 3345 ml   Net -3243.17 ml    PHYSICAL EXAMINATION:  GENERAL:  65 y.o.-year-old patient lying in the bed with no acute distress. LUNGS: Normal breath sounds bilaterally, no wheezing, rales,rhonchi or crepitation. No use of accessory muscles of respiration.  CARDIOVASCULAR: S1, S2 normal. No murmurs, rubs, or gallops.  ABDOMEN: Soft, non-tender, non-distended. Bowel sounds present. No organomegaly or mass.  EXTREMITIES: No pedal edema, cyanosis, or clubbing.  NEUROLOGIC: non focal PSYCHIATRIC: The patient is alert and oriented x 3.  SKIN: No obvious rash, lesion, or ulcer.   DATA REVIEW:   CBC  Recent Labs  Lab 06/08/20 0413  WBC 8.9  HGB 13.7  HCT 39.5  PLT 227    Chemistries  Recent Labs  Lab 06/06/20 1500 06/07/20 0453 06/08/20 0413  NA 139   < > 137  K 3.9   < > 3.7  CL 108   < > 104  CO2 24   < > 25  GLUCOSE 152*   < > 83  BUN 18   < > 15  CREATININE 1.06   < > 0.78  CALCIUM 8.2*   < > 8.5*  MG  --    < > 1.8  AST 36  --   --   ALT 39  --   --  ALKPHOS 62  --   --   BILITOT 0.7  --   --    < > = values in this interval not displayed.    Microbiology Results   Recent Results (from the past 240 hour(s))  Resp Panel by RT-PCR (Flu A&B, Covid) Nasopharyngeal Swab     Status: None   Collection Time: 06/06/20  3:00 PM   Specimen: Nasopharyngeal Swab; Nasopharyngeal(NP) swabs in vial transport medium  Result Value Ref Range Status   SARS Coronavirus 2 by RT PCR NEGATIVE NEGATIVE Final    Comment: (NOTE) SARS-CoV-2 target nucleic acids are NOT DETECTED.  The SARS-CoV-2 RNA is generally detectable in upper respiratory specimens during the acute phase of infection. The lowest concentration of SARS-CoV-2 viral copies this assay can detect is 138 copies/mL. A negative result does not preclude SARS-Cov-2 infection and should not be used as the sole basis for treatment or other patient management decisions. A negative result may occur with  improper specimen  collection/handling, submission of specimen other than nasopharyngeal swab, presence of viral mutation(s) within the areas targeted by this assay, and inadequate number of viral copies(<138 copies/mL). A negative result must be combined with clinical observations, patient history, and epidemiological information. The expected result is Negative.  Fact Sheet for Patients:  BloggerCourse.comhttps://www.fda.gov/media/152166/download  Fact Sheet for Healthcare Providers:  SeriousBroker.ithttps://www.fda.gov/media/152162/download  This test is no t yet approved or cleared by the [place] FDA and  has been authorized for detection and/or diagnosis of SARS-CoV-2 by FDA under an Emergency Use Authorization (EUA). This EUA will remain  in effect (meaning this test can be used) for the duration of the COVID-19 declaration under Section 564(b)(1) of the Act, 21 U.S.C.section 360bbb-3(b)(1), unless the authorization is terminated  or revoked sooner.       Influenza A by PCR NEGATIVE NEGATIVE Final   Influenza B by PCR NEGATIVE NEGATIVE Final    Comment: (NOTE) The Xpert Xpress SARS-CoV-2/FLU/RSV plus assay is intended as an aid in the diagnosis of influenza from Nasopharyngeal swab specimens and should not be used as a sole basis for treatment. Nasal washings and aspirates are unacceptable for Xpert Xpress SARS-CoV-2/FLU/RSV testing.  Fact Sheet for Patients: BloggerCourse.comhttps://www.fda.gov/media/152166/download  Fact Sheet for Healthcare Providers: SeriousBroker.ithttps://www.fda.gov/media/152162/download  This test is not yet approved or cleared by the [place] FDA and has been authorized for detection and/or diagnosis of SARS-CoV-2 by FDA under an Emergency Use Authorization (EUA). This EUA will remain in effect (meaning this test can be used) for the duration of the COVID-19 declaration under Section 564(b)(1) of the Act, 21 U.S.C. section 360bbb-3(b)(1), unless the authorization is terminated or revoked.  Performed at [facility], [address] [place], [place] [address]   MRSA PCR Screening     Status: None   Collection Time: 06/06/20  6:09 PM   Specimen: Nasopharyngeal  Result Value Ref Range Status   MRSA by PCR NEGATIVE NEGATIVE Final    Comment:        The GeneXpert MRSA Assay (FDA approved for NASAL specimens only), is one component of a comprehensive MRSA colonization surveillance program. It is not intended to diagnose MRSA infection nor to guide or monitor treatment for MRSA infections. Performed at [facility], [address] [place], [place] [address]   CULTURE, BLOOD (ROUTINE X 2) w Reflex to ID Panel     Status: None (Preliminary result)   Collection Time: 06/06/20  6:37 PM   Specimen: BLOOD  Result Value Ref  Range Status   Specimen Description BLOOD BLOOD LEFT HAND  Final   Special Requests   Final    BOTTLES DRAWN AEROBIC ONLY Blood Culture adequate volume   Culture   Final    NO GROWTH 3 DAYS Performed at [facility], [address] [place], [place] [address]    Report Status PENDING  Incomplete  CULTURE, BLOOD (ROUTINE X 2) w Reflex to ID Panel     Status: None (Preliminary result)   Collection Time: 06/06/20  6:40 PM   Specimen: BLOOD  Result Value Ref Range Status   Specimen Description BLOOD LEFT ANTECUBITAL  Final   Special Requests   Final    BOTTLES DRAWN AEROBIC AND ANAEROBIC Blood Culture adequate volume   Culture   Final    NO GROWTH 3 DAYS Performed at [facility], [address] [place], [place] [address]    Report Status PENDING  Incomplete    RADIOLOGY:  EEG adult  Result Date: 06/07/2020 Charlsie Quest, MD     06/07/2020  3:28 PM Patient Name: James Porter MRN: [id number] Epilepsy Attending: Charlsie Quest Referring Physician/Provider: Harlon Ditty, NP Date: 06/07/2020 Duration: 25.12 mins Patient history: 65 year old male with altered mental status.  EEG to evaluate for seizures. Level of alertness:   lethargic AEDs during EEG study: None Technical aspects: This EEG study was done with scalp electrodes positioned according to the 10-20 International system of electrode placement. Electrical activity was acquired at a sampling rate of 500Hz  and reviewed with a high frequency filter of 70Hz  and a low frequency filter of 1Hz . EEG data were recorded continuously and digitally stored. Description: The posterior dominant rhythm consists of 7 Hz activity of moderate voltage (25-35 uV) seen predominantly in posterior head regions, symmetric and reactive to eye opening and eye closing. EEG showed continuous generalized 5 to 7 Hz theta as well as intermittent generalized 2 to 3 Hz delta slowing. Physiologic photic driving was not seen during photic stimulation. Hyperventilation was not performed.   ABNORMALITY - Continuous slow, generalized - Background slow IMPRESSION: This study is suggestive of mild to moderate diffuse encephalopathy, nonspecific etiology. No seizures or epileptiform discharges were seen throughout the recording.   ECHOCARDIOGRAM COMPLETE  Result Date: 06/08/2020    ECHOCARDIOGRAM REPORT   Patient Name:   [facility] Date of Exam: 06/08/2020 Medical Rec #:  Steele Berg          Height:       69.0 in Accession #:    [id number]         Weight:       200.0 lb Date of Birth:  [birth year]           BSA:          2.066 m Patient Age:    64 years           BP:           134/83 mmHg Patient Gender: M                  HR:           76 bpm. Exam Location:  [facility] Procedure: 2D Echo, Color Doppler and Cardiac Doppler Indications:     NSTEMI I21.4  History:         Patient has no prior history of Echocardiogram examinations.  CAD; Risk Factors:Hypertension.  Sonographer:     Cristela Blue RDCS (AE) Referring Phys:  [id number] Sondra Barges Diagnosing Phys: Debbe Odea MD  Sonographer Comments: Suboptimal apical window. IMPRESSIONS  1. Left ventricular ejection fraction, by  estimation, is 65 to 70%. The left ventricle has normal function. The left ventricle has no regional wall motion abnormalities. Left ventricular diastolic parameters are consistent with Grade I diastolic dysfunction (impaired relaxation).  2. Right ventricular systolic function is normal. The right ventricular size is normal.  3. The mitral valve is normal in structure. Trivial mitral valve regurgitation.  4. The aortic valve was not well visualized. Aortic valve regurgitation is not visualized. FINDINGS  Left Ventricle: Left ventricular ejection fraction, by estimation, is 65 to 70%. The left ventricle has normal function. The left ventricle has no regional wall motion abnormalities. The left ventricular internal cavity size was normal in size. There is  no left ventricular hypertrophy. Left ventricular diastolic parameters are consistent with Grade I diastolic dysfunction (impaired relaxation). Right Ventricle: The right ventricular size is normal. No increase in right ventricular wall thickness. Right ventricular systolic function is normal. Left Atrium: Left atrial size was normal in size. Right Atrium: Right atrial size was normal in size. Pericardium: There is no evidence of pericardial effusion. Mitral Valve: The mitral valve is normal in structure. Trivial mitral valve regurgitation. Tricuspid Valve: The tricuspid valve is not well visualized. Tricuspid valve regurgitation is not demonstrated. Aortic Valve: The aortic valve was not well visualized. Aortic valve regurgitation is not visualized. Aortic valve mean gradient measures 5.0 mmHg. Aortic valve peak gradient measures 9.6 mmHg. Aortic valve area, by VTI measures 2.71 cm. Pulmonic Valve: The pulmonic valve was not well visualized. Pulmonic valve regurgitation is not visualized. Aorta: The aortic root is normal in size and structure. Venous: The inferior vena cava was not well visualized. IAS/Shunts: No atrial level shunt detected by color flow Doppler.   LEFT VENTRICLE PLAX 2D LVIDd:         4.18 cm  Diastology LVIDs:         2.29 cm  LV e' medial:    6.20 cm/s LV PW:         0.91 cm  LV E/e' medial:  11.5 LV IVS:        0.78 cm  LV e' lateral:   9.25 cm/s LVOT diam:     2.00 cm  LV E/e' lateral: 7.7 LV SV:         88 LV SV Index:   42 LVOT Area:     3.14 cm  RIGHT VENTRICLE RV S prime:     13.50 cm/s TAPSE (M-mode): 4.2 cm LEFT ATRIUM             Index       RIGHT ATRIUM           Index LA diam:        2.50 cm 1.21 cm/m  RA Area:     10.90 cm LA Vol (A2C):   20.8 ml 10.07 ml/m RA Volume:   21.40 ml  10.36 ml/m LA Vol (A4C):   24.0 ml 11.62 ml/m LA Biplane Vol: 22.7 ml 10.99 ml/m  AORTIC VALVE                    PULMONIC VALVE AV Area (Vmax):    2.66 cm     PV Vmax:        0.81 m/s AV Area (Vmean):  2.77 cm     PV Peak grad:   2.6 mmHg AV Area (VTI):     2.71 cm     RVOT Peak grad: 2 mmHg AV Vmax:           155.00 cm/s AV Vmean:          108.000 cm/s AV VTI:            0.323 m AV Peak Grad:      9.6 mmHg AV Mean Grad:      5.0 mmHg LVOT Vmax:         131.00 cm/s LVOT Vmean:        95.200 cm/s LVOT VTI:          0.279 m LVOT/AV VTI ratio: 0.86  AORTA Ao Root diam: 3.47 cm MITRAL VALVE                TRICUSPID VALVE MV Area (PHT): 3.99 cm     TR Peak grad:   12.4 mmHg MV Decel Time: 190 msec     TR Vmax:        176.00 cm/s MV E velocity: 71.10 cm/s MV A velocity: 106.00 cm/s  SHUNTS MV E/A ratio:  0.67         Systemic VTI:  0.28 m                             Systemic Diam: 2.00 cm Debbe Odea MD Electronically signed by Debbe Odea MD Signature Date/Time: 06/08/2020/1:45:07 PM    Final      CODE STATUS:     Code Status Orders  (From admission, onward)         Start     Ordered   06/06/20 1648  Full code  Continuous        06/06/20 1649        Code Status History    This patient has a current code status but no historical code status.   Advance Care Planning Activity       TOTAL TIME TAKING CARE OF THIS PATIENT: *40*  minutes.    Enedina Finner M.D  [facility]    CC: Primary care physician; Evie Lacks, NP (Inactive)

## 2020-06-09 NOTE — Telephone Encounter (Signed)
Order placed for [facility]- ordered under Dr. Okey Dupre.  To scheduling to reach out to the patient to arrange for this to be done in ~ 2 weeks as the patient was discharged with a ZIO AT monitor.  Will also forward to Dr. Okey Dupre to sign the attestation order.   Will ask that scheduling notify triage when the test is scheduled so we may reach out to the patient to go over her pre-test instructions.

## 2020-06-09 NOTE — Telephone Encounter (Signed)
-----   Message from Cadence David Stall, PA-C sent at 06/09/2020 11:01 AM EDT ----- Regarding: hospital follow-up Needs hospital follow-up for 4-6 weeks with End/APP. He has heart monitor at discharge for possible syncope, so after results. Please call and schedule. Thanks

## 2020-06-09 NOTE — Progress Notes (Signed)
Progress Note  Patient Name: James Porter Date of Encounter: 06/09/2020  [facility] Cardiologist: Dr. Okey Dupre  Subjective   Reports he feels he is at his baseline, would like to go home Unfortunately drink coffee this morning, stress test was canceled Telemetry reviewed no significant arrhythmia We discussed events leading to his admission with him, was giving treats to his dogs, " being social" when acutely had syncope  Denies taking any narcotics leading to admission/syncope Elevated troponin felt secondary to demand ischemia from his syncope  Echocardiogram with normal LV function no wall motion abnormality  He prefers to have stress testing done as outpatient  Inpatient Medications    Scheduled Meds: . amoxicillin-clavulanate  1 tablet Oral Q12H  . aspirin EC  81 mg Oral Daily  . atorvastatin  40 mg Oral QHS  . Chlorhexidine Gluconate Cloth  6 each Topical Daily  . COVID-19 mRNA vaccine (Moderna)  0.25 mL Intramuscular Once  . escitalopram  20 mg Oral Daily  . famotidine  20 mg Oral BID  . gabapentin  600 mg Oral TID  . insulin aspart  0-15 Units Subcutaneous Q4H  . lisinopril  10 mg Oral Daily  . mouth rinse  15 mL Mouth Rinse BID  . meloxicam  15 mg Oral Daily  . pantoprazole  40 mg Oral Daily  . timolol  1 drop Both Eyes BID   Continuous Infusions: . sodium chloride Stopped (06/07/20 1428)   PRN Meds: cyclobenzaprine, docusate, ipratropium-albuterol, polyethylene glycol   Vital Signs    Vitals:   06/09/20 0100 06/09/20 0200 06/09/20 0500 06/09/20 0800  BP:  134/72  (!) 147/80  Pulse: 62 61  (!) 58  Resp:  18    Temp:  99.1 F (37.3 C)  98.7 F (37.1 C)  TempSrc:  Oral  Oral  SpO2: 94% 93%  95%  Weight:   91 kg   Height:        Intake/Output Summary (Last 24 hours) at 06/09/2020 1120 Last data filed at 06/09/2020 0800 Gross per 24 hour  Intake 101.83 ml  Output 3345 ml  Net -3243.17 ml   Last 3 Weights 06/09/2020 06/08/2020 06/07/2020   Weight (lbs) 200 lb 9.9 oz 199 lb 15.3 oz 199 lb 15.3 oz  Weight (kg) 91 kg 90.7 kg 90.7 kg      Telemetry    Normal sinus rhythm- Personally Reviewed  ECG     - Personally Reviewed  Physical Exam   GEN: No acute distress.   Neck: No JVD Cardiac: RRR, no murmurs, rubs, or gallops.  Respiratory: Clear to auscultation bilaterally. GI: Soft, nontender, non-distended  MS: No edema; No deformity. Neuro:  Nonfocal  Psych: Normal affect   Labs    High Sensitivity Troponin:   Recent Labs  Lab 06/06/20 1500 06/06/20 1841 06/06/20 2058 06/06/20 2325  TROPONINIHS 26* 216* 291* 262*      Chemistry Recent Labs  Lab 06/06/20 1500 06/07/20 0453 06/08/20 0413  NA 139 138 137  K 3.9 3.7 3.7  CL 108 106 104  CO2 24 24 25   GLUCOSE 152* 101* 83  BUN 18 18 15   CREATININE 1.06 0.82 0.78  CALCIUM 8.2* 8.2* 8.5*  PROT 6.0*  --   --   ALBUMIN 3.5  --   --   AST 36  --   --   ALT 39  --   --   ALKPHOS 62  --   --   BILITOT 0.7  --   --  GFRNONAA >60 >60 >60  ANIONGAP 7 8 8      Hematology Recent Labs  Lab 06/06/20 1500 06/07/20 0453 06/08/20 0413  WBC 8.4 11.1* 8.9  RBC 4.47 4.58 4.72  HGB 12.9* 13.1 13.7  HCT 38.1* 38.2* 39.5  MCV 85.2 83.4 83.7  MCH 28.9 28.6 29.0  MCHC 33.9 34.3 34.7  RDW 13.0 13.2 12.7  PLT 216 255 227    BNPNo results for input(s): BNP, PROBNP in the last 168 hours.   DDimer No results for input(s): DDIMER in the last 168 hours.   Radiology    EEG adult  Result Date: 06/07/2020 06/09/2020, MD     06/07/2020  3:28 PM Patient Name: HUSSAM MUNIZ MRN: [facility] Epile[facility] Attending: [id number] Referring Physician/Provider: Charlsie Quest, NP Date: 06/07/2020 Duration: 25.12 mins Patient history: 65 year old male with altered mental status.  EEG to evaluate for seizures. Level of alertness:  lethargic AEDs during EEG study: None Technical aspects: This EEG study was done with scalp electrodes positioned according to the 10-20  International system of electrode placement. Electrical activity was acquired at a sampling rate of 500Hz  and reviewed with a high frequency filter of 70Hz  and a low frequency filter of 1Hz . EEG data were recorded continuously and digitally stored. Description: The posterior dominant rhythm consists of 7 Hz activity of moderate voltage (25-35 uV) seen predominantly in posterior head regions, symmetric and reactive to eye opening and eye closing. EEG showed continuous generalized 5 to 7 Hz theta as well as intermittent generalized 2 to 3 Hz delta slowing. Physiologic photic driving was not seen during photic stimulation. Hyperventilation was not performed.   ABNORMALITY - Continuous slow, generalized - Background slow IMPRESSION: This study is suggestive of mild to moderate diffuse encephalopathy, nonspecific etiology. No seizures or epileptiform discharges were seen throughout the recording. 76   ECHOCARDIOGRAM COMPLETE  Result Date: 06/08/2020    ECHOCARDIOGRAM REPORT   Patient Name:   ASHTEN PRATS [facility] Date of Exam: 06/08/2020 Medical Rec #:  06/10/2020          Height:       69.0 in Accession #:    [id number] Berg         Weight:       200.0 lb Date of Birth:  September 05, 1955           BSA:          2.066 m Patient Age:    64 years           BP:           134/83 mmHg Patient Gender: M                  HR:           76 bpm. Exam Location:  [facility] Procedure: 2D Echo, Color Doppler and Cardiac Doppler Indications:     NSTEMI I21.4  History:         Patient has no prior history of Echocardiogram examinations.                  CAD; Risk Factors:Hypertension.  Sonographer:     [id number] RDCS (AE) Referring Phys:  [id number] 08/29/1955 Diagnosing Phys: 12-27-2001 MD  Sonographer Comments: Suboptimal apical window. IMPRESSIONS  1. Left ventricular ejection fraction, by estimation, is 65 to 70%. The left ventricle has normal function. The left ventricle has no regional wall motion abnormalities. Left ventricular  diastolic parameters  are consistent with Grade I diastolic dysfunction (impaired relaxation).  2. Right ventricular systolic function is normal. The right ventricular size is normal.  3. The mitral valve is normal in structure. Trivial mitral valve regurgitation.  4. The aortic valve was not well visualized. Aortic valve regurgitation is not visualized. FINDINGS  Left Ventricle: Left ventricular ejection fraction, by estimation, is 65 to 70%. The left ventricle has normal function. The left ventricle has no regional wall motion abnormalities. The left ventricular internal cavity size was normal in size. There is  no left ventricular hypertrophy. Left ventricular diastolic parameters are consistent with Grade I diastolic dysfunction (impaired relaxation). Right Ventricle: The right ventricular size is normal. No increase in right ventricular wall thickness. Right ventricular systolic function is normal. Left Atrium: Left atrial size was normal in size. Right Atrium: Right atrial size was normal in size. Pericardium: There is no evidence of pericardial effusion. Mitral Valve: The mitral valve is normal in structure. Trivial mitral valve regurgitation. Tricuspid Valve: The tricuspid valve is not well visualized. Tricuspid valve regurgitation is not demonstrated. Aortic Valve: The aortic valve was not well visualized. Aortic valve regurgitation is not visualized. Aortic valve mean gradient measures 5.0 mmHg. Aortic valve peak gradient measures 9.6 mmHg. Aortic valve area, by VTI measures 2.71 cm. Pulmonic Valve: The pulmonic valve was not well visualized. Pulmonic valve regurgitation is not visualized. Aorta: The aortic root is normal in size and structure. Venous: The inferior vena cava was not well visualized. IAS/Shunts: No atrial level shunt detected by color flow Doppler.  LEFT VENTRICLE PLAX 2D LVIDd:         4.18 cm  Diastology LVIDs:         2.29 cm  LV e' medial:    6.20 cm/s LV PW:         0.91 cm  LV E/e'  medial:  11.5 LV IVS:        0.78 cm  LV e' lateral:   9.25 cm/s LVOT diam:     2.00 cm  LV E/e' lateral: 7.7 LV SV:         88 LV SV Index:   42 LVOT Area:     3.14 cm  RIGHT VENTRICLE RV S prime:     13.50 cm/s TAPSE (M-mode): 4.2 cm LEFT ATRIUM             Index       RIGHT ATRIUM           Index LA diam:        2.50 cm 1.21 cm/m  RA Area:     10.90 cm LA Vol (A2C):   20.8 ml 10.07 ml/m RA Volume:   21.40 ml  10.36 ml/m LA Vol (A4C):   24.0 ml 11.62 ml/m LA Biplane Vol: 22.7 ml 10.99 ml/m  AORTIC VALVE                    PULMONIC VALVE AV Area (Vmax):    2.66 cm     PV Vmax:        0.81 m/s AV Area (Vmean):   2.77 cm     PV Peak grad:   2.6 mmHg AV Area (VTI):     2.71 cm     RVOT Peak grad: 2 mmHg AV Vmax:           155.00 cm/s AV Vmean:          108.000 cm/s AV VTI:  0.323 m AV Peak Grad:      9.6 mmHg AV Mean Grad:      5.0 mmHg LVOT Vmax:         131.00 cm/s LVOT Vmean:        95.200 cm/s LVOT VTI:          0.279 m LVOT/AV VTI ratio: 0.86  AORTA Ao Root diam: 3.47 cm MITRAL VALVE                TRICUSPID VALVE MV Area (PHT): 3.99 cm     TR Peak grad:   12.4 mmHg MV Decel Time: 190 msec     TR Vmax:        176.00 cm/s MV E velocity: 71.10 cm/s MV A velocity: 106.00 cm/s  SHUNTS MV E/A ratio:  0.67         Systemic VTI:  0.28 m                             Systemic Diam: 2.00 cm Debbe OdeaBrian Agbor-Etang MD Electronically signed by Debbe OdeaBrian Agbor-Etang MD Signature Date/Time: 06/08/2020/1:45:07 PM    Final     Cardiac Studies     Patient Profile     65 y.o. male   Assessment & Plan    Syncope Wife gave chest compressions in the field, Intubated on arrival for airway protection, He denies any drugs of abuse leading to presentation He does have gabapentin and medications for muscle spasms, had no improvement in the field with Narcan -- He does have risk factors for cardiac disease, very mildly elevated troponin --- Normal echocardiogram --- Recommend we place a ZIO monitor at the time  of discharge --- He does not want to stay for stress testing, we will arrange outpatient stress test  Elevated troponins, Very mild coronary calcifications noted on chest CT -Troponins peaked at 291. -Currently denies angina -Aspirin 81 mg, Lipitor -Echo with preserved ejection fraction Plan for outpatient stress testing   Hyperlipidemia -Lipitor  Current smoker -Cessation advised  Long discussion with him concerning events leading to admission, case discussed with hospitalist service Discharge instructions provided, outpatient follow-up and orders placed  Total encounter time more than 35 minutes  Greater than 50% was spent in counseling and coordination of care with the patient     For questions or updates, please contact [facility] Please consult [email or web address] for contact info under        Signed, Julien Nordmannimothy Dimonique Bourdeau, MD  06/09/2020, 11:20 AM

## 2020-06-09 NOTE — Discharge Instructions (Signed)
Wear your Zio monitor and follow instructions for the same

## 2020-06-10 ENCOUNTER — Other Ambulatory Visit: Payer: Medicare Other

## 2020-06-10 NOTE — Telephone Encounter (Signed)
Lexiscan scheduled for 06/17/20 at 08:30 AM

## 2020-06-10 NOTE — Telephone Encounter (Signed)
Attempted to call left message to call back

## 2020-06-11 LAB — CULTURE, BLOOD (ROUTINE X 2)
Culture: NO GROWTH
Culture: NO GROWTH
Special Requests: ADEQUATE
Special Requests: ADEQUATE

## 2020-06-13 NOTE — Telephone Encounter (Signed)
  Patient and the patients daughter given verbal pre-test instructions with verbalized understanding.  [facility] MYOVIEW  Your caregiver has ordered a Stress Test with nuclear imaging. The purpose of this test is to evaluate the blood supply to your heart muscle. This procedure is referred to as a "Non-Invasive Stress Test." This is because other than having an IV started in your vein, nothing is inserted or "invades" your body. Cardiac stress tests are done to find areas of poor blood flow to the heart by determining the extent of coronary artery disease (CAD). Some patients exercise on a treadmill, which naturally increases the blood flow to your heart, while others who are  unable to walk on a treadmill due to physical limitations have a pharmacologic/chemical stress agent called Lexiscan . This medicine will mimic walking on a treadmill by temporarily increasing your coronary blood flow.   Please note: these test may take anywhere between 2-4 hours to complete  PLEASE REPORT TO [facility] ENTRANCE  THE VOLUNTEERS AT THE FIRST DESK WILL DIRECT YOU WHERE TO GO  Date of Procedure:_____________________________________  Arrival Time for Procedure:______________________________  Instructions regarding medication:    PLEASE NOTIFY THE OFFICE AT LEAST 24 HOURS IN ADVANCE IF YOU ARE UNABLE TO KEEP YOUR APPOINTMENT.  [phone] AND  PLEASE NOTIFY NUCLEAR MEDICINE AT [facility] AT LEAST 24 HOURS IN ADVANCE IF YOU ARE UNABLE TO KEEP YOUR APPOINTMENT. [phone]  How to prepare for your Myoview test:  1. Do not eat or drink after midnight 2. No caffeine for 24 hours prior to test 3. No smoking 24 hours prior to test. 4. Your medication may be taken with water.  If your doctor stopped a medication because of this test, do not take that medication. 5. Ladies, please do not wear dresses.  Skirts or pants are appropriate. Please wear a short sleeve shirt. 6. No perfume, cologne or lotion. 7. Wear  comfortable walking shoes. No heels!

## 2020-06-13 NOTE — Telephone Encounter (Signed)
Patient will need to be given pre-test instructions for his upcoming lexiscan. Called the patient. No answer, No voicemail option.

## 2020-06-17 ENCOUNTER — Encounter
Admission: RE | Admit: 2020-06-17 | Discharge: 2020-06-17 | Disposition: A | Discharge status: Home / Self Care | Payer: Medicare Other | Source: Ambulatory Visit | Attending: Internal Medicine | Admitting: Internal Medicine

## 2020-06-17 ENCOUNTER — Other Ambulatory Visit: Payer: Self-pay

## 2020-06-17 ENCOUNTER — Other Ambulatory Visit: Payer: Medicare Other

## 2020-06-17 DIAGNOSIS — I2510 Atherosclerotic heart disease of native coronary artery without angina pectoris: Secondary | ICD-10-CM | POA: Insufficient documentation

## 2020-06-17 DIAGNOSIS — R778 Other specified abnormalities of plasma proteins: Secondary | ICD-10-CM | POA: Diagnosis not present

## 2020-06-17 DIAGNOSIS — I2584 Coronary atherosclerosis due to calcified coronary lesion: Secondary | ICD-10-CM | POA: Diagnosis not present

## 2020-06-17 MED ORDER — REGADENOSON 0.4 MG/5ML IV SOLN
0.4000 mg | Freq: Once | INTRAVENOUS | Status: AC
  Administered 2020-06-17: 0.4 mg via INTRAVENOUS
  Filled 2020-06-17: qty 5

## 2020-06-17 MED ORDER — TECHNETIUM TC 99M TETROFOSMIN IV KIT
32.9700 | PACK | Freq: Once | INTRAVENOUS | Status: AC | PRN
  Administered 2020-06-17: 32.97 via INTRAVENOUS

## 2020-06-17 MED ORDER — TECHNETIUM TC 99M TETROFOSMIN IV KIT
10.0000 | PACK | Freq: Once | INTRAVENOUS | Status: AC | PRN
  Administered 2020-06-17: 10.21 via INTRAVENOUS

## 2020-06-19 LAB — NM MYOCAR MULTI W/SPECT W/WALL MOTION / EF
Estimated workload: 1 METS
Exercise duration (min): 0 min
Exercise duration (sec): 0 s
LV dias vol: 37 mL (ref 62–150)
LV sys vol: 23 mL
MPHR: 155 {beats}/min
Peak HR: 96 {beats}/min
Percent HR: 61 %
Rest HR: 65 {beats}/min
SDS: 0
SRS: 3
SSS: 0
TID: 0.66

## 2020-06-27 LAB — BLOOD GAS, VENOUS
Acid-Base Excess: 1.4 mmol/L (ref 0.0–2.0)
Bicarbonate: 27.7 mmol/L (ref 20.0–28.0)
FIO2: 1
MECHVT: 550 mL
O2 Saturation: 95.8 %
PEEP: 5 cmH2O
Patient temperature: 37
RATE: 16 resp/min
pCO2, Ven: 49 mmHg (ref 44.0–60.0)
pH, Ven: 7.36 (ref 7.250–7.430)
pO2, Ven: 84 mmHg — ABNORMAL HIGH (ref 32.0–45.0)

## 2020-07-21 ENCOUNTER — Ambulatory Visit: Payer: Medicare Other | Admitting: Internal Medicine

## 2020-07-26 ENCOUNTER — Telehealth: Payer: Self-pay | Admitting: Medical

## 2020-07-26 NOTE — Telephone Encounter (Signed)
Cadence David Stall, PA-C  07/26/2020  1:38 PM EDT      Heart monitor showed predominately normal rhthym, rare PACS/PVCs, short atrialruns, no significant sustained arrhythmias. No clear reason for syncope.

## 2020-07-26 NOTE — Telephone Encounter (Signed)
Attempted to call the patient. No answer- I left a message to please call back.  

## 2020-07-27 NOTE — Telephone Encounter (Signed)
Left voicemail message to call back for review of results.  

## 2020-07-27 NOTE — Telephone Encounter (Signed)
Patient returning call.

## 2020-07-29 NOTE — Telephone Encounter (Signed)
Reviewed results and recommendations with patient and confirmed upcoming appointment as well. He verbalized understanding with no further questions at this time.

## 2020-07-29 NOTE — Telephone Encounter (Signed)
Left voicemail message to call back for review of results.  

## 2020-08-24 ENCOUNTER — Ambulatory Visit: Payer: Medicare Other | Admitting: Internal Medicine

## 2020-08-24 NOTE — Progress Notes (Deleted)
   Follow-up Outpatient Visit Date: 08/24/2020  Primary Care Provider: Evie Lacks, NP (Inactive) [address] [place] [address]  Chief Complaint: ***  HPI:  Mr. Reim is a 65 y.o. male with history of coronary artery calcification, hypertension, adrenal adenoma, chronic back pain, and tobacco use, who presents for follow-up of coronary artery calcification and elevated troponin in the setting of aspiration pneumonia and possible drug overdose leading to respiratory failure requiring mechanical ventilation.  Echo showed normal LVEF without wall motion abnormality.  Ambulatory monitoring after discharge with no significant arrhythmia.  Pharmacologic myocardial perfusion stress test showed no ischemia or scar.  LVEF was felt to be artifactually low due to GI uptake, given normal EF on preceding echo.  --------------------------------------------------------------------------------------------------  Past Medical History:  Diagnosis Date   Arthritis    CAD (coronary artery disease)    Deep vein thrombosis (DVT) (HCC)    HTN (hypertension)    Hyperlipidemia    Smoker    Past Surgical History:  Procedure Laterality Date   CARPAL TUNNEL RELEASE Right 04/17/2017   Procedure: RIGHT CARPAL TUNNEL RELEASE ENDOSCOPIC WITH ANTERIOR TRANSPOSITION OF ULNAR NERVE OF RIGHT ELBOW;  Surgeon: Christena Flake, MD;  Location: [facility];  Service: Orthopedics;  Laterality: Right;   LUMBAR LAMINECTOMY     VASCULAR SURGERY  2011   to remove blood clot in his leg    No outpatient medications have been marked as taking for the 08/24/20 encounter (Appointment) with Davian Wollenberg, Cristal Deer, MD.    Allergies: Patient has no known allergies.  Social History   Tobacco Use   Smoking status: Every Day    Packs/day: 0.50    Types: Cigarettes   Smokeless tobacco: Never  Substance Use Topics   Alcohol use: No   Drug use: No    No family history on file.  Review of Systems: A 12-system review  of systems was performed and was negative except as noted in the HPI.  --------------------------------------------------------------------------------------------------  Physical Exam: There were no vitals taken for this visit.  General:  NAD. Neck: No JVD or HJR. Lungs: Clear to auscultation bilaterally without wheezes or crackles. Heart: Regular rate and rhythm without murmurs, rubs, or gallops. Abdomen: Soft, nontender, nondistended. Extremities: No lower extremity edema.  EKG:  ***  Lab Results  Component Value Date   WBC 8.9 06/08/2020   HGB 13.7 06/08/2020   HCT 39.5 06/08/2020   MCV 83.7 06/08/2020   PLT 227 06/08/2020    Lab Results  Component Value Date   NA 137 06/08/2020   K 3.7 06/08/2020   CL 104 06/08/2020   CO2 25 06/08/2020   BUN 15 06/08/2020   CREATININE 0.78 06/08/2020   GLUCOSE 83 06/08/2020   ALT 39 06/06/2020    Lab Results  Component Value Date   TRIG 136 06/07/2020    --------------------------------------------------------------------------------------------------  ASSESSMENT AND PLAN: Cristal Deer Safia Panzer, MD 08/24/2020 6:26 AM

## 2022-11-13 IMAGING — DX DG CHEST 1V PORT
1 series · 2 of 2 positions shown · non-contrast
Comparison: Film from earlier in the same day.

CLINICAL DATA: Status post intubation

EXAM:
PORTABLE CHEST 1 VIEW

[Series 1: chest ap · 0.14mm/px · 2 of 2 slices shown]
[im 1/2]
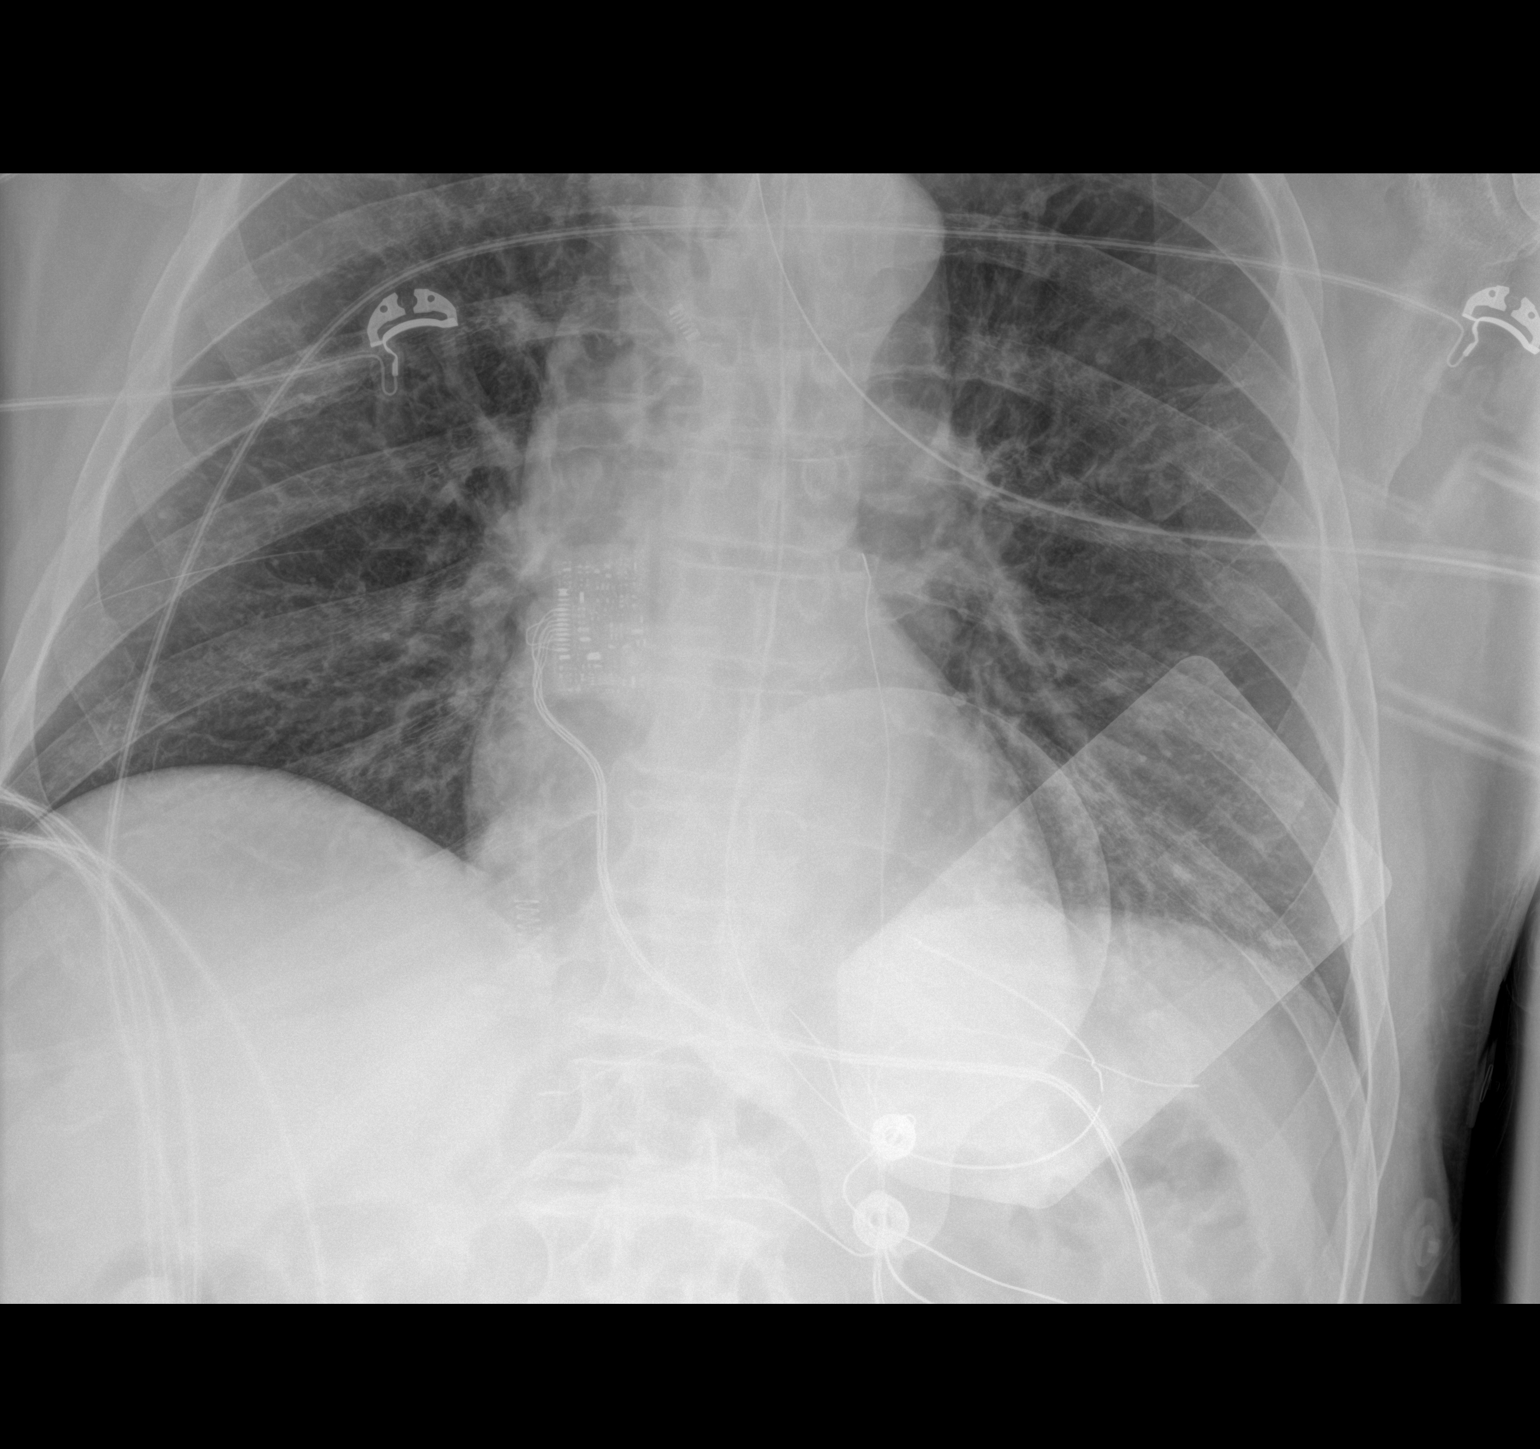
[im 2/2]
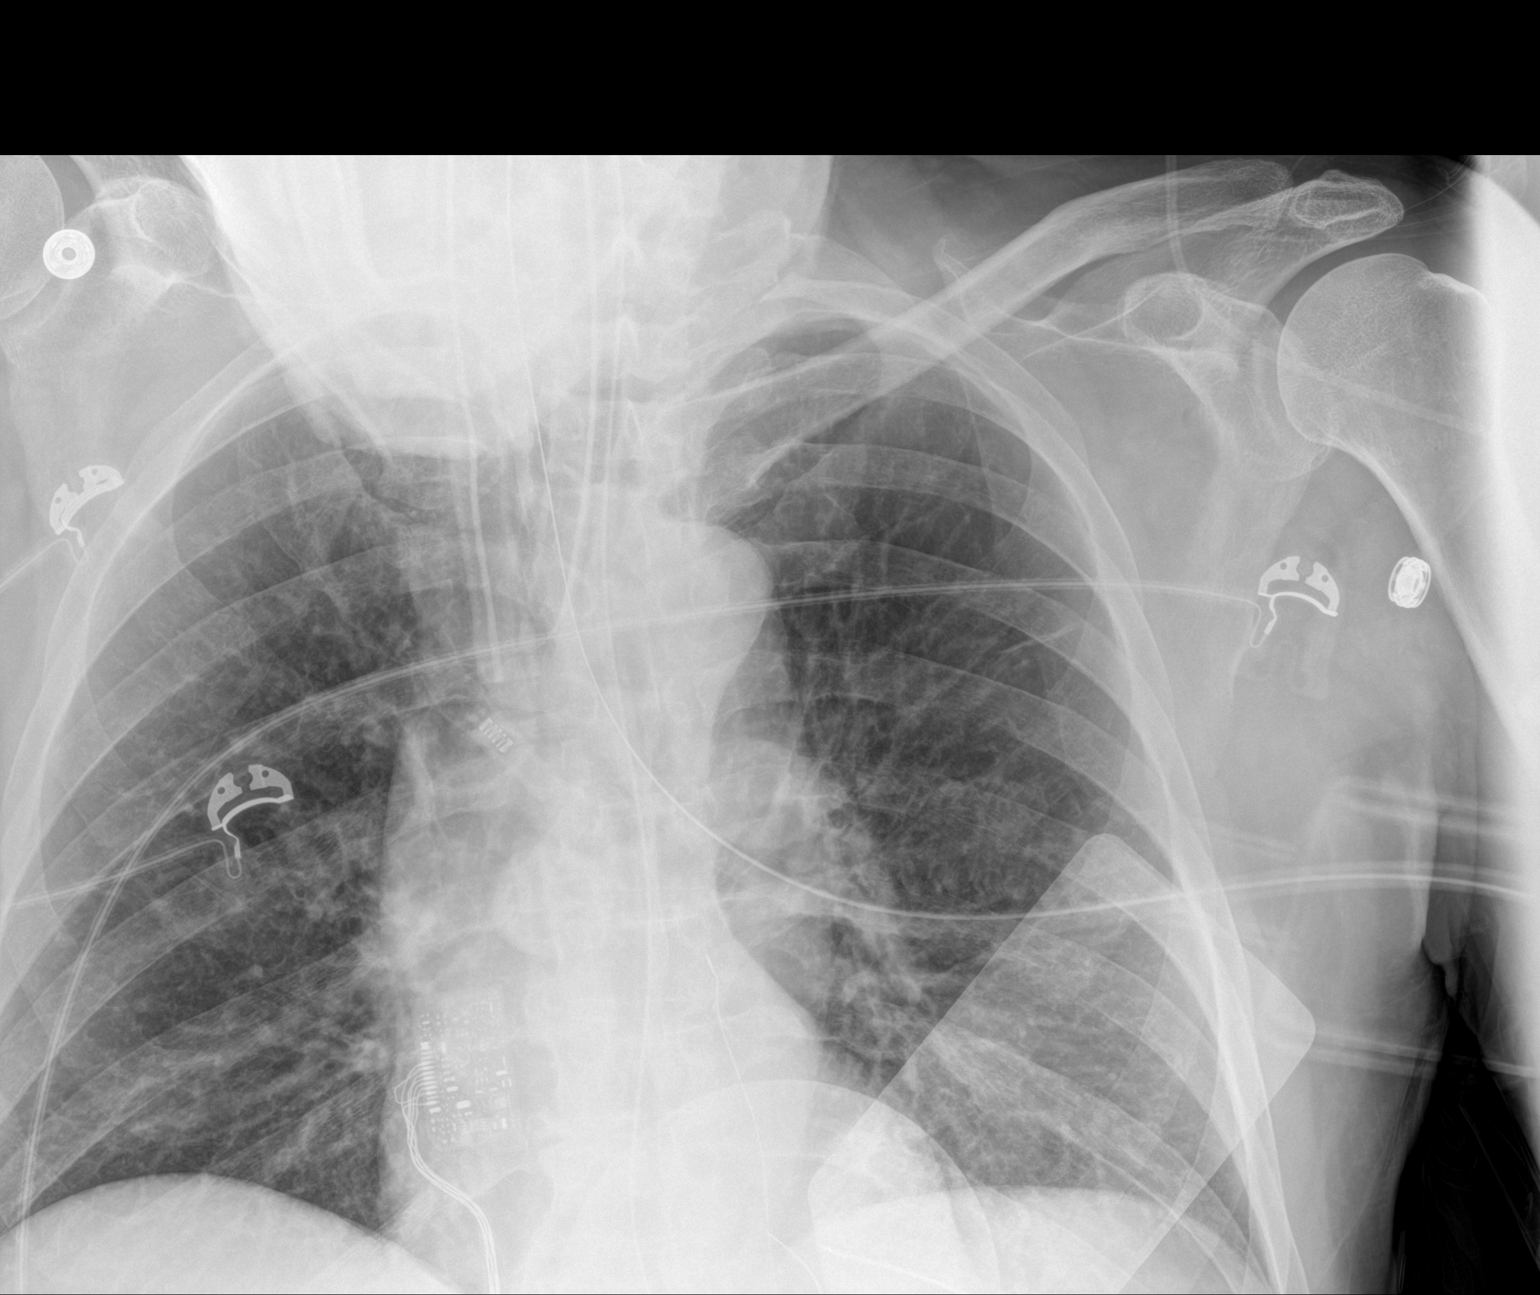

[2 of 2 positions shown; findings below may reference images not displayed]

FINDINGS: Endotracheal tube and gastric catheter are noted in satisfactory
position and stable. Lungs are clear. No bony abnormality is noted.
IMPRESSION: Tubes and lines as described above stable in appearance.

No other focal abnormality is noted.

## 2022-11-13 IMAGING — CT CT CHEST-ABD-PELV W/O CM
2 of 4 series · 14 of 36 positions shown, 16 images · non-contrast
Comparison: March 13, 2019

CLINICAL DATA: Found apneic.

EXAM:
CT CHEST, ABDOMEN AND PELVIS WITHOUT CONTRAST
TECHNIQUE: Multidetector CT imaging of the chest, abdomen and pelvis was
performed following the standard protocol without IV contrast.

[Series 2: cap wo st · axial · 0.85mm/px · z∈[-904,-329]mm · 11 of 139 slices shown, 13 images]
[im 12/139  mediastinal]
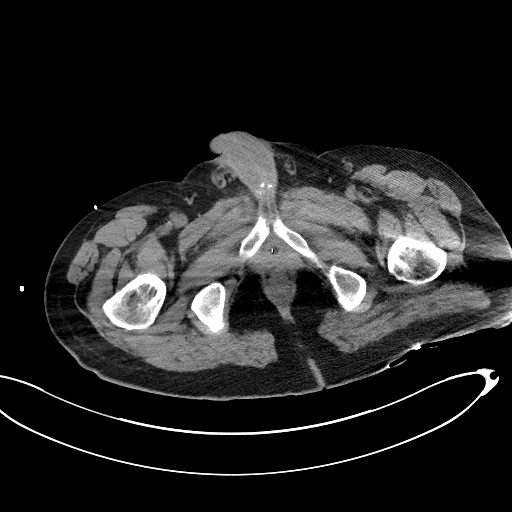
[im 12/139  bone]
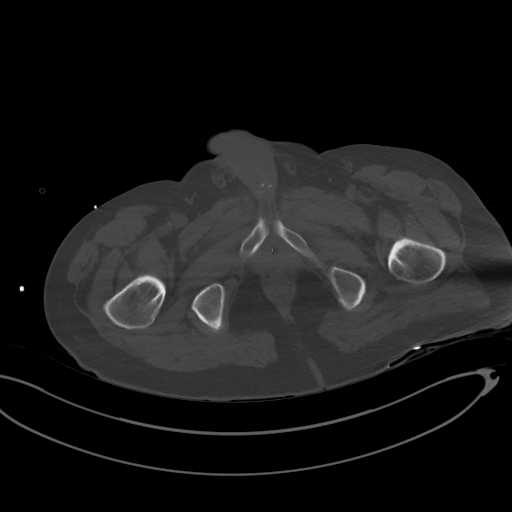
[im 24/139  mediastinal]
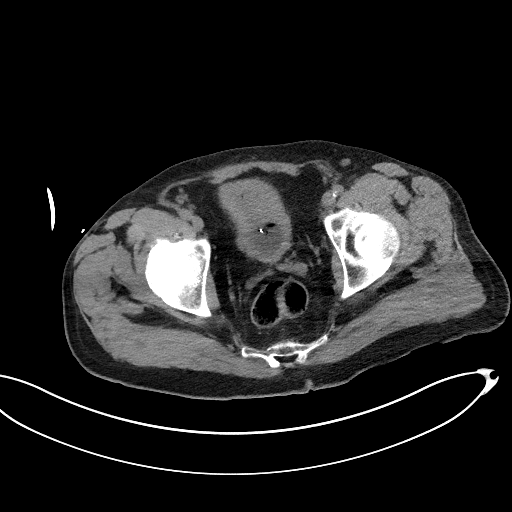
[im 35/139  mediastinal]
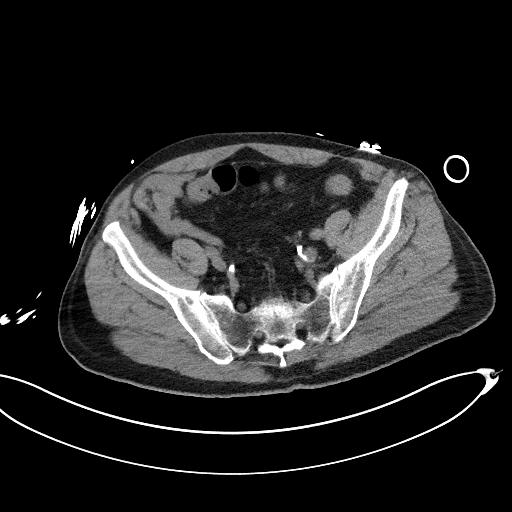
[im 47/139  mediastinal]
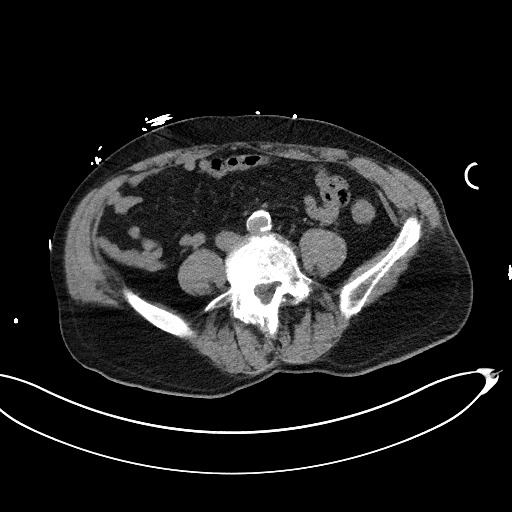
[im 58/139  mediastinal]
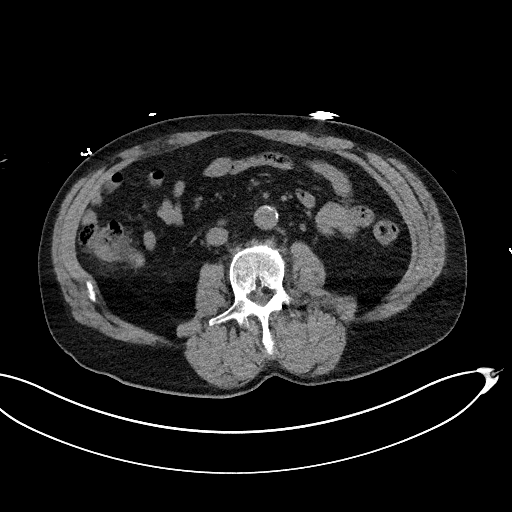
[im 70/139  mediastinal]
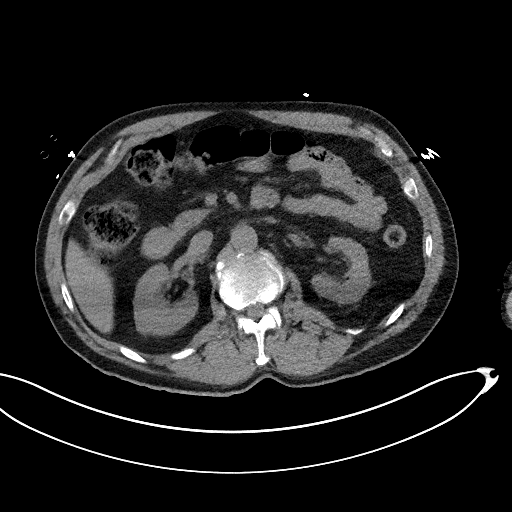
[im 81/139  mediastinal]
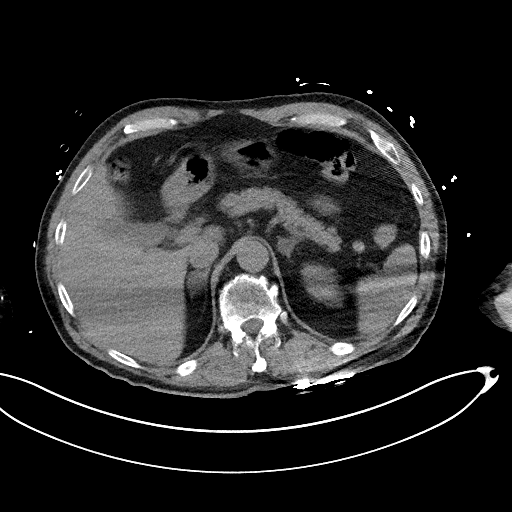
[im 93/139  mediastinal]
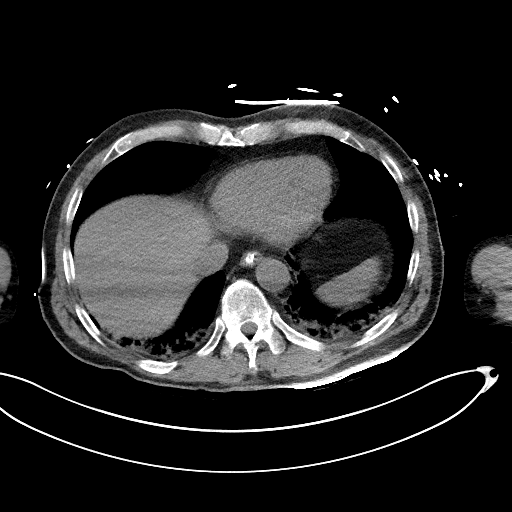
[im 104/139  mediastinal]
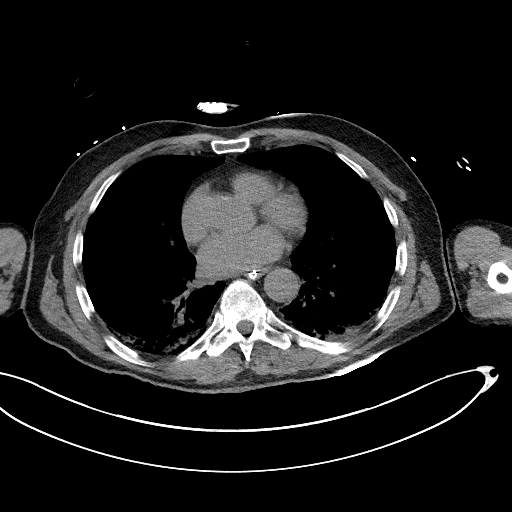
[im 104/139  bone]
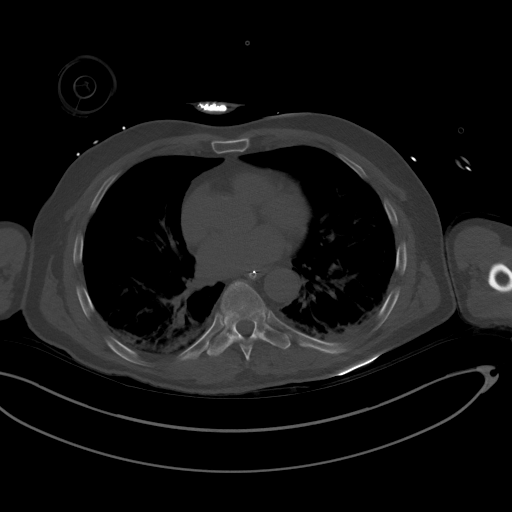
[im 116/139  mediastinal]
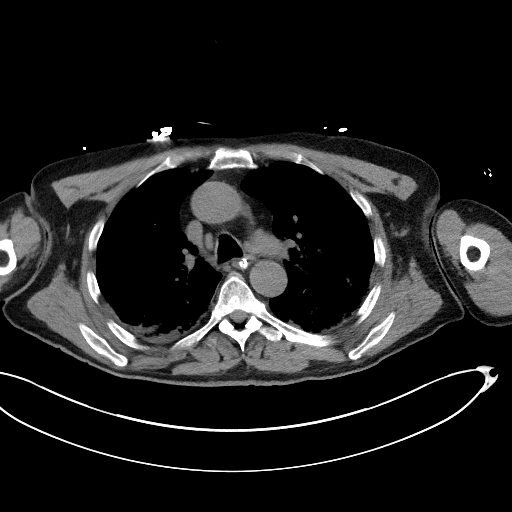
[im 127/139  mediastinal]
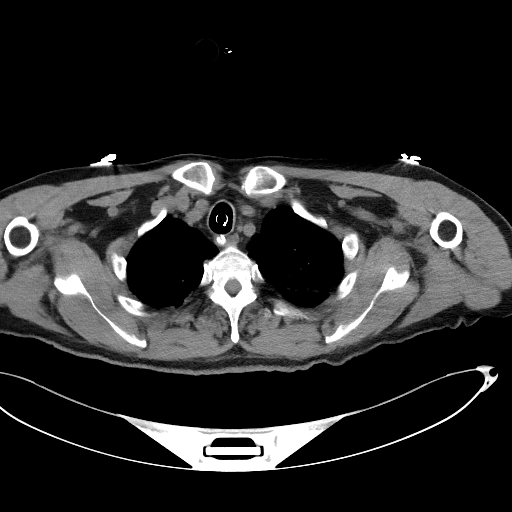

[Series 5: coronal · coronal · 0.86mm/px · 3 of 128 slices shown]
[im 26/128  mediastinal]
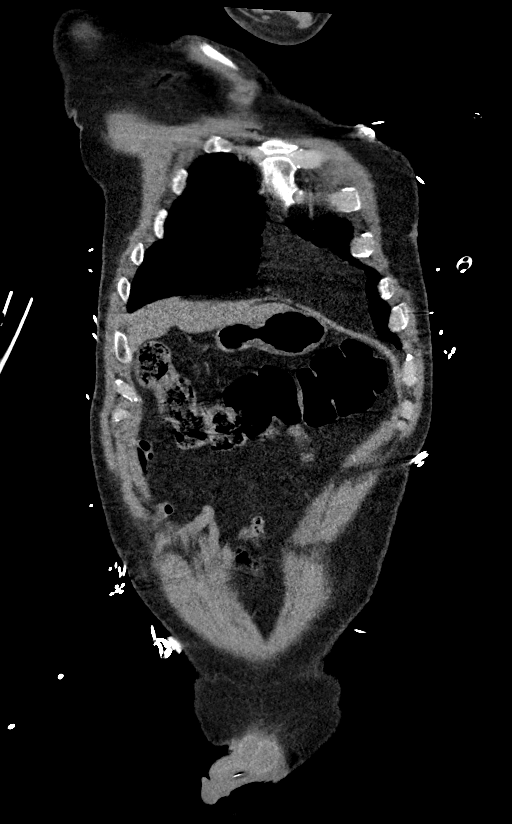
[im 51/128  mediastinal]
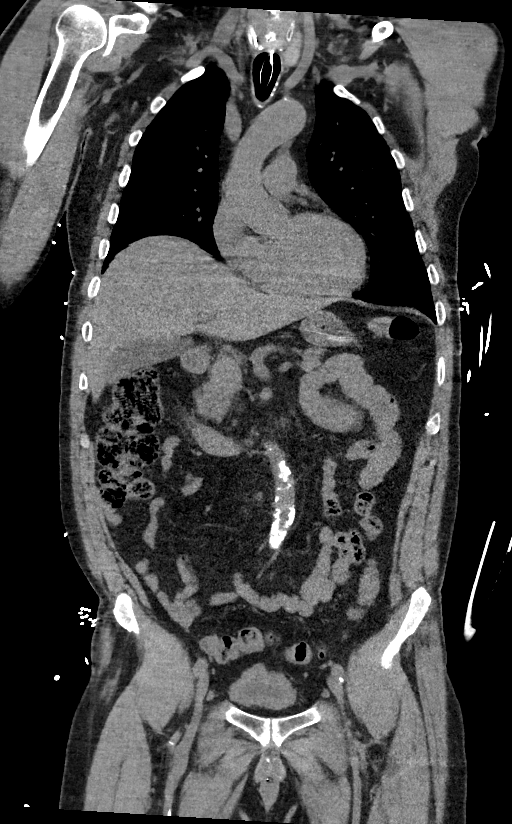
[im 77/128  mediastinal]
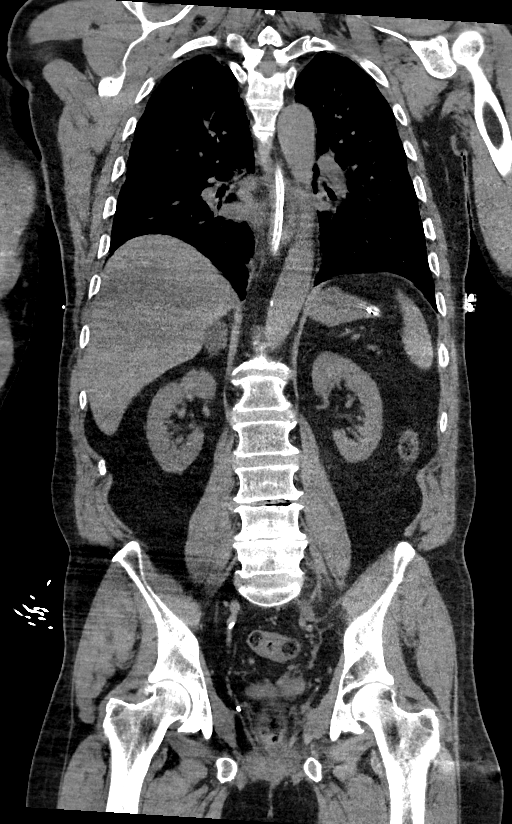

[14 of 36 positions shown; findings below may reference images not displayed]

FINDINGS: CT CHEST FINDINGS

Cardiovascular: There is mild calcification of the aortic arch.
Normal heart size with mild coronary artery calcification. No
pericardial effusion.

Mediastinum/Nodes: Endotracheal and nasogastric tubes are in place.
No enlarged mediastinal, hilar, or axillary lymph nodes. Thyroid
gland, trachea, and esophagus demonstrate no significant findings.

Lungs/Pleura: Moderate severity areas of scarring, atelectasis
and/or early infiltrate are seen within the posterior aspects of the
bilateral upper lobes and bilateral lower lobes.

There is no evidence of a pleural effusion or pneumothorax.

Musculoskeletal: Acute nondisplaced anterior fourth left rib
fracture is seen (axial CT image 34 through 37, CT series number 2).
Degenerative changes are noted throughout the thoracic spine.

CT ABDOMEN PELVIS FINDINGS

Hepatobiliary: No focal liver abnormality is seen. No gallstones,
gallbladder wall thickening, or biliary dilatation.

Pancreas: Unremarkable. No pancreatic ductal dilatation or
surrounding inflammatory changes.

Spleen: Normal in size without focal abnormality.

Adrenals/Urinary Tract: A stable 2.0 cm x 2.0 cm low-attenuation
right adrenal mass is seen. Stable 1.1 cm x 0.8 cm and 1.3 cm x
cm low-attenuation left adrenal masses are also noted. Kidneys are
normal, without renal calculi, focal lesion, or hydronephrosis. A
Foley catheter is seen within the urinary bladder.

Stomach/Bowel: Stomach is within normal limits. Appendix appears
normal. No evidence of bowel wall thickening, distention, or
inflammatory changes. Noninflamed diverticula are seen throughout
the sigmoid colon.

Vascular/Lymphatic: Aortic atherosclerosis. No enlarged abdominal or
pelvic lymph nodes.

Reproductive: Prostate is unremarkable.

Other: No abdominal wall hernia or abnormality. No abdominopelvic
ascites.

Musculoskeletal: Degenerative changes seen throughout the lumbar
spine.
IMPRESSION: 1. Moderate severity bilateral upper lobe and bilateral lower lobe
scarring, atelectasis and/or early infiltrate.
2. Suspected stable bilateral adrenal adenomas.
3. Sigmoid diverticulosis.

## 2022-11-14 IMAGING — DX DG CHEST 1V PORT
1 series · 1 of 1 positions shown · non-contrast
Comparison: 06/06/2020

CLINICAL DATA: Acute respiratory failure

EXAM:
PORTABLE CHEST 1 VIEW

[chest ap]
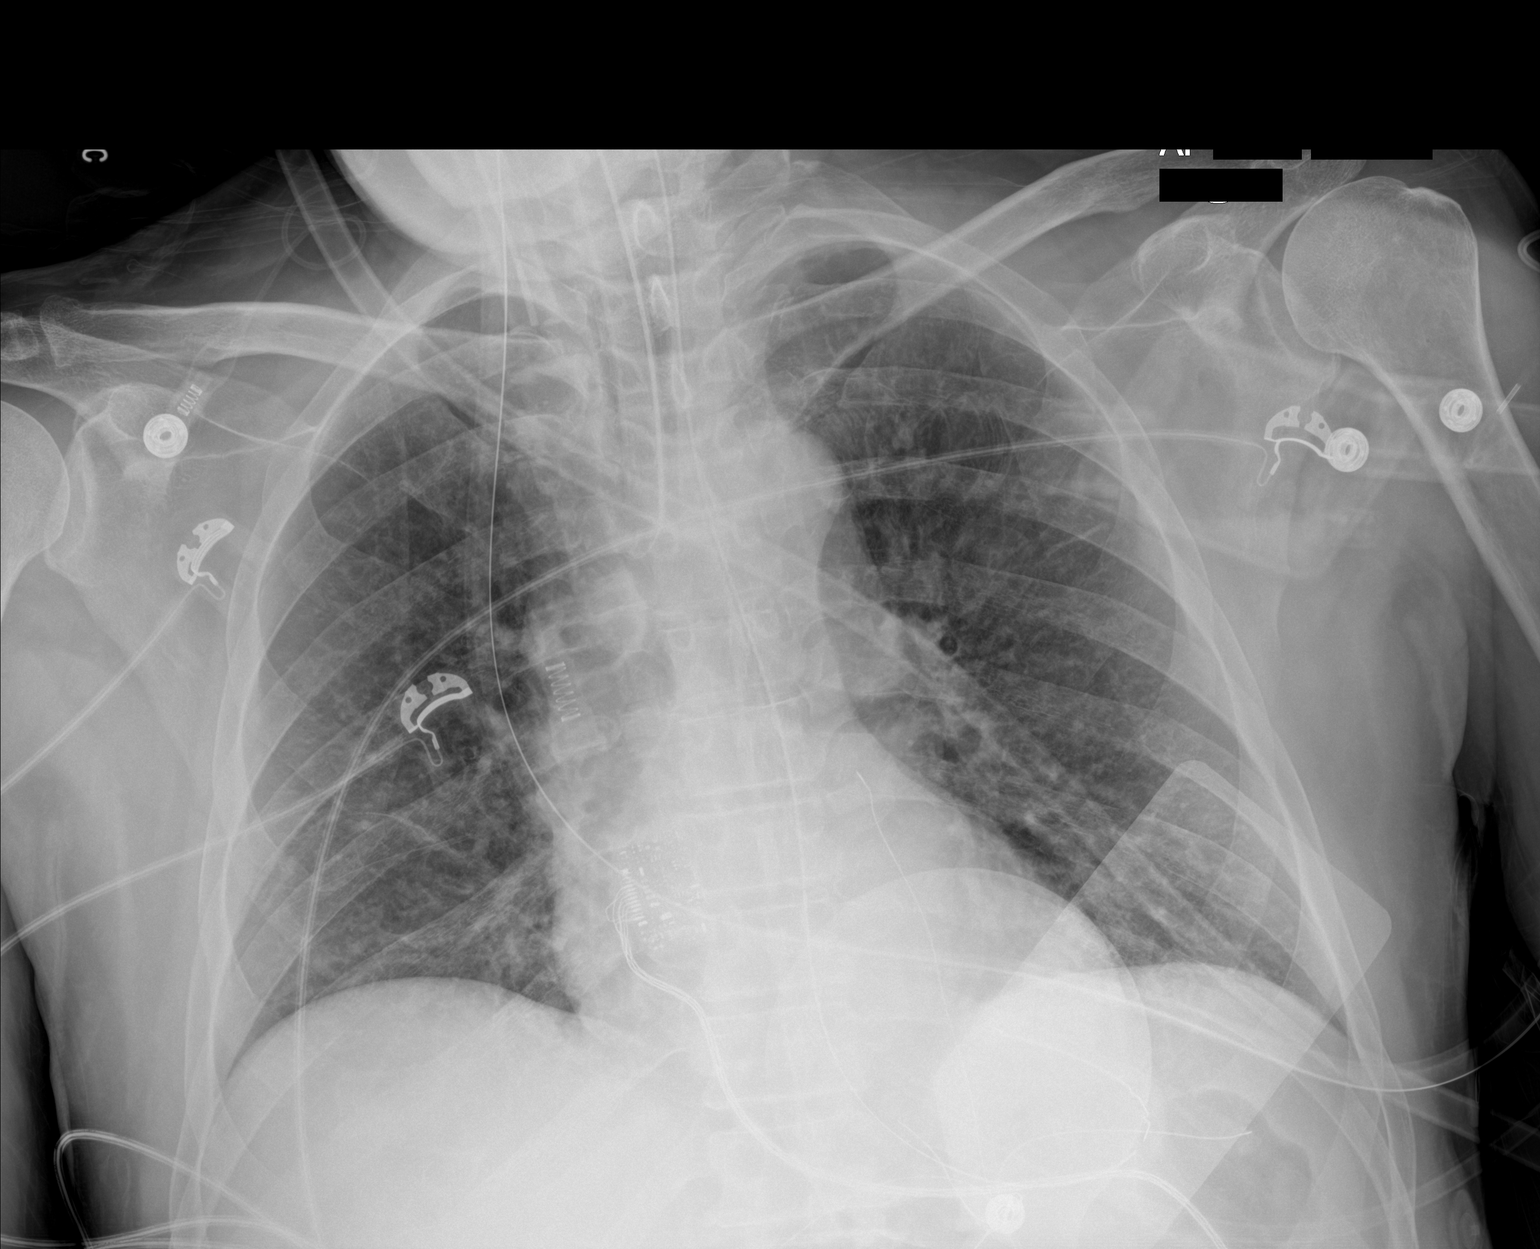

[1 of 1 positions shown; findings below may reference images not displayed]

FINDINGS: Endotracheal tube seen 3.4 cm above the carina. Nasogastric tube
extends into the upper gastric fundus. Lungs are clear. No
pneumothorax or pleural effusion. Cardiac size within normal limits.
Pulmonary vascularity is normal. No acute bone abnormality.
IMPRESSION: Support tubes in appropriate position.

No focal pulmonary infiltrate.

## 2023-05-15 NOTE — Progress Notes (Unsigned)
   New Patient Office Visit  Subjective    Patient ID: James Porter, male    DOB: [birth year]  Age: 68 y.o. MRN: [id number]  CC: No chief complaint on file.   HPI ART LEVAN presents to establish care ***  Outpatient Encounter Medications as of 05/16/2023  Medication Sig   acetaminophen  (TYLENOL ) 650 MG CR tablet Take 650 mg by mouth every 8 (eight) hours as needed for pain.   atorvastatin  (LIPITOR) 40 MG tablet Take 40 mg by mouth at bedtime.   cyclobenzaprine  (FLEXERIL ) 10 MG tablet Take 10 mg by mouth 2 (two) times daily as needed for muscle spasms.   escitalopram  (LEXAPRO ) 20 MG tablet Take 20 mg by mouth daily.   famotidine  (PEPCID ) 20 MG tablet Take 20 mg by mouth 2 (two) times daily.   gabapentin  (NEURONTIN ) 300 MG capsule Take 600 mg by mouth 3 (three) times daily.   lisinopril  (ZESTRIL ) 10 MG tablet Take 10 mg by mouth daily.   meloxicam  (MOBIC ) 15 MG tablet Take 15 mg by mouth daily.   timolol  (TIMOPTIC ) 0.5 % ophthalmic solution Place 1 drop into both eyes 2 (two) times daily.   No facility-administered encounter medications on file as of 05/16/2023.    Past Medical History:  Diagnosis Date   Arthritis    CAD (coronary artery disease)    Deep vein thrombosis (DVT) (HCC)    HTN (hypertension)    Hyperlipidemia    Smoker     Past Surgical History:  Procedure Laterality Date   CARPAL TUNNEL RELEASE Right 04/17/2017   Procedure: RIGHT CARPAL TUNNEL RELEASE ENDOSCOPIC WITH ANTERIOR TRANSPOSITION OF ULNAR NERVE OF RIGHT ELBOW;  Surgeon: Elner Hahn, MD;  Location: [facility];  Service: Orthopedics;  Laterality: Right;   LUMBAR LAMINECTOMY     VASCULAR SURGERY  2011   to remove blood clot in his leg    No family history on file.  Social History   Socioeconomic History   Marital status: Married    Spouse name: Not on file   Number of children: Not on file   Years of education: Not on file   Highest education level: Not on file   Occupational History   Not on file  Tobacco Use   Smoking status: Every Day    Current packs/day: 0.50    Types: Cigarettes   Smokeless tobacco: Never  Substance and Sexual Activity   Alcohol use: No   Drug use: No   Sexual activity: Not on file  Other Topics Concern   Not on file  Social History Narrative   Not on file   Social Drivers of Health   Financial Resource Strain: Not on file  Food Insecurity: Not on file  Transportation Needs: Not on file  Physical Activity: Not on file  Stress: Not on file  Social Connections: Not on file  Intimate Partner Violence: Not on file    ROS      Objective    There were no vitals taken for this visit.  Physical Exam  {Labs (Optional):23779}    Assessment & Plan:   Problem List Items Addressed This Visit   None   No follow-ups on file.   Christi Coward, CMA

## 2023-05-16 ENCOUNTER — Encounter: Payer: Self-pay | Admitting: Physician Assistant

## 2023-05-16 ENCOUNTER — Ambulatory Visit (INDEPENDENT_AMBULATORY_CARE_PROVIDER_SITE_OTHER): Admitting: Physician Assistant

## 2023-05-16 VITALS — BP 112/74 | HR 107 | Temp 97.8°F | Ht 69.0 in | Wt 146.0 lb

## 2023-05-16 DIAGNOSIS — I10 Essential (primary) hypertension: Secondary | ICD-10-CM | POA: Diagnosis not present

## 2023-05-16 DIAGNOSIS — F203 Undifferentiated schizophrenia: Secondary | ICD-10-CM | POA: Diagnosis not present

## 2023-05-16 DIAGNOSIS — J984 Other disorders of lung: Secondary | ICD-10-CM | POA: Insufficient documentation

## 2023-05-16 DIAGNOSIS — F331 Major depressive disorder, recurrent, moderate: Secondary | ICD-10-CM | POA: Diagnosis not present

## 2023-05-16 DIAGNOSIS — G8929 Other chronic pain: Secondary | ICD-10-CM | POA: Insufficient documentation

## 2023-05-16 DIAGNOSIS — I2510 Atherosclerotic heart disease of native coronary artery without angina pectoris: Secondary | ICD-10-CM

## 2023-05-16 DIAGNOSIS — Z125 Encounter for screening for malignant neoplasm of prostate: Secondary | ICD-10-CM | POA: Insufficient documentation

## 2023-05-16 DIAGNOSIS — D497 Neoplasm of unspecified behavior of endocrine glands and other parts of nervous system: Secondary | ICD-10-CM | POA: Diagnosis not present

## 2023-05-16 DIAGNOSIS — F17200 Nicotine dependence, unspecified, uncomplicated: Secondary | ICD-10-CM | POA: Insufficient documentation

## 2023-05-16 DIAGNOSIS — F17218 Nicotine dependence, cigarettes, with other nicotine-induced disorders: Secondary | ICD-10-CM

## 2023-05-16 DIAGNOSIS — H409 Unspecified glaucoma: Secondary | ICD-10-CM | POA: Insufficient documentation

## 2023-05-16 DIAGNOSIS — M25562 Pain in left knee: Secondary | ICD-10-CM

## 2023-05-16 NOTE — Assessment & Plan Note (Signed)
 CT indicated potential benign adrenal tumors, follow-up imaging not completed. - Order imaging to reassess adrenal gland tumors. - Evaluate for any changes in size or characteristics of the tumors.

## 2023-05-16 NOTE — Assessment & Plan Note (Signed)
 Diagnosed with glaucoma, previously prescribed eye drops not refilled. - Refer to an ophthalmologist for evaluation and management. - Ensure follow-up with an eye specialist for potential prescription of eye drops.

## 2023-05-16 NOTE — Assessment & Plan Note (Signed)
 Admits to frequent nocturia PSA drawn today Will refer to urology depending on symptoms

## 2023-05-16 NOTE — Assessment & Plan Note (Signed)
 Continue to monitor diet and exercise Labs drawn today Will adjust treatment based on labs

## 2023-05-16 NOTE — Assessment & Plan Note (Signed)
 Flight of ideas and tangential thought process Frequent highs and lows in emotions during visit Possible overlying depression with prolonged grief for passing of wife Sent referral to psychiatry.

## 2023-05-16 NOTE — Assessment & Plan Note (Signed)
 Chronic hand pain with loss of control, no numbness or tingling. - Consider over-the-counter analgesics such as Tylenol  Arthritis or Aleve as needed. - Evaluate for potential prescription options if over-the-counter medications are ineffective.

## 2023-05-16 NOTE — Assessment & Plan Note (Signed)
 Previous CT showed lung scarring and fluid, possibly from past pneumonia. Current symptoms may relate to smoking history. - Order chest imaging to assess current lung condition. - Evaluate for COPD or other lung conditions based on imaging results.

## 2023-05-16 NOTE — Assessment & Plan Note (Signed)
 Controlled Denies taking any medications currently Continue to monitor symptoms for elevated BP Labs drawn today Will adjust treatment depending on symptoms and labs

## 2023-05-16 NOTE — Assessment & Plan Note (Signed)
 Continued grief for the passing of his wife over 1 year ago Will send referral to psychiatry

## 2023-05-16 NOTE — Assessment & Plan Note (Signed)
 Admits to smoking about half a pack a week along with vaping History of scarring of lungs Will send for CT of lungs to look at scarring

## 2023-05-17 ENCOUNTER — Ambulatory Visit (HOSPITAL_BASED_OUTPATIENT_CLINIC_OR_DEPARTMENT_OTHER): Admitting: Radiology

## 2023-05-17 LAB — COMPREHENSIVE METABOLIC PANEL WITH GFR
ALT: 15 IU/L (ref 0–44)
AST: 20 IU/L (ref 0–40)
Albumin: 5.2 g/dL — ABNORMAL HIGH (ref 3.9–4.9)
Alkaline Phosphatase: 99 IU/L (ref 44–121)
BUN/Creatinine Ratio: 11 (ref 10–24)
BUN: 16 mg/dL (ref 8–27)
Bilirubin Total: 0.7 mg/dL (ref 0.0–1.2)
CO2: 20 mmol/L (ref 20–29)
Calcium: 11.4 mg/dL — ABNORMAL HIGH (ref 8.6–10.2)
Chloride: 100 mmol/L (ref 96–106)
Creatinine, Ser: 1.45 mg/dL — ABNORMAL HIGH (ref 0.76–1.27)
Globulin, Total: 2.8 g/dL (ref 1.5–4.5)
Glucose: 94 mg/dL (ref 70–99)
Potassium: 5.7 mmol/L — ABNORMAL HIGH (ref 3.5–5.2)
Sodium: 145 mmol/L — ABNORMAL HIGH (ref 134–144)
Total Protein: 8 g/dL (ref 6.0–8.5)
eGFR: 52 mL/min/{1.73_m2} — ABNORMAL LOW (ref >59)

## 2023-05-17 LAB — HEMOGLOBIN A1C
Est. average glucose Bld gHb Est-mCnc: 114 mg/dL
Hgb A1c MFr Bld: 5.6 % (ref 4.8–5.6)

## 2023-05-17 LAB — T4, FREE: Free T4: 1.64 ng/dL (ref 0.82–1.77)

## 2023-05-17 LAB — CBC WITH DIFFERENTIAL/PLATELET
Basophils Absolute: 0.1 10*3/uL (ref 0.0–0.2)
Basos: 1 %
EOS (ABSOLUTE): 0.3 10*3/uL (ref 0.0–0.4)
Eos: 3 %
Hematocrit: 50.8 % (ref 37.5–51.0)
Hemoglobin: 16.7 g/dL (ref 13.0–17.7)
Immature Grans (Abs): 0 10*3/uL (ref 0.0–0.1)
Immature Granulocytes: 0 %
Lymphocytes Absolute: 2.2 10*3/uL (ref 0.7–3.1)
Lymphs: 22 %
MCH: 28.2 pg (ref 26.6–33.0)
MCHC: 32.9 g/dL (ref 31.5–35.7)
MCV: 86 fL (ref 79–97)
Monocytes Absolute: 0.9 10*3/uL (ref 0.1–0.9)
Monocytes: 9 %
Neutrophils Absolute: 6.7 10*3/uL (ref 1.4–7.0)
Neutrophils: 65 %
Platelets: 364 10*3/uL (ref 150–450)
RBC: 5.92 x10E6/uL — ABNORMAL HIGH (ref 4.14–5.80)
RDW: 12.8 % (ref 11.6–15.4)
WBC: 10.1 10*3/uL (ref 3.4–10.8)

## 2023-05-17 LAB — TSH: TSH: 4.09 u[IU]/mL (ref 0.450–4.500)

## 2023-05-17 LAB — LIPID PANEL
Chol/HDL Ratio: 3.9 ratio (ref 0.0–5.0)
Cholesterol, Total: 291 mg/dL — ABNORMAL HIGH (ref 100–199)
HDL: 75 mg/dL (ref >39)
LDL Chol Calc (NIH): 192 mg/dL — ABNORMAL HIGH (ref 0–99)
Triglycerides: 136 mg/dL (ref 0–149)
VLDL Cholesterol Cal: 24 mg/dL (ref 5–40)

## 2023-05-17 LAB — PSA: Prostate Specific Ag, Serum: 1.3 ng/mL (ref 0.0–4.0)

## 2023-05-20 ENCOUNTER — Other Ambulatory Visit: Payer: Self-pay | Admitting: Physician Assistant

## 2023-05-20 ENCOUNTER — Encounter: Payer: Self-pay | Admitting: Physician Assistant

## 2023-05-21 ENCOUNTER — Encounter: Payer: Self-pay | Admitting: Physician Assistant

## 2023-05-21 ENCOUNTER — Ambulatory Visit (HOSPITAL_BASED_OUTPATIENT_CLINIC_OR_DEPARTMENT_OTHER)
Admission: RE | Admit: 2023-05-21 | Discharge: 2023-05-21 | Disposition: A | Discharge status: Home / Self Care | Source: Ambulatory Visit | Attending: Physician Assistant | Admitting: Radiology

## 2023-05-21 ENCOUNTER — Telehealth: Payer: Self-pay

## 2023-05-21 DIAGNOSIS — M25562 Pain in left knee: Secondary | ICD-10-CM

## 2023-05-21 DIAGNOSIS — J984 Other disorders of lung: Secondary | ICD-10-CM | POA: Diagnosis not present

## 2023-05-21 DIAGNOSIS — D497 Neoplasm of unspecified behavior of endocrine glands and other parts of nervous system: Secondary | ICD-10-CM | POA: Diagnosis not present

## 2023-05-21 DIAGNOSIS — F17210 Nicotine dependence, cigarettes, uncomplicated: Secondary | ICD-10-CM

## 2023-05-21 DIAGNOSIS — F17218 Nicotine dependence, cigarettes, with other nicotine-induced disorders: Secondary | ICD-10-CM

## 2023-05-21 DIAGNOSIS — G8929 Other chronic pain: Secondary | ICD-10-CM | POA: Diagnosis not present

## 2023-05-21 NOTE — Telephone Encounter (Signed)
 Called [facility] and changed order to STAT.

## 2023-05-21 NOTE — Telephone Encounter (Signed)
-----   Message from Odilia Bennett sent at 05/20/2023  8:34 PM EDT ----- Regarding: CT He is scheduled for a CT on 05/21/2023, can we call and upgrade that to a STAT order please. Thank you

## 2023-05-23 ENCOUNTER — Other Ambulatory Visit: Payer: Self-pay

## 2023-05-23 MED ORDER — ATORVASTATIN CALCIUM 10 MG PO TABS: 10.0000 mg | ORAL_TABLET | Freq: Every day | ORAL | 3 refills | Status: DC

## 2023-05-23 MED ORDER — ATORVASTATIN CALCIUM 20 MG PO TABS: 20.0000 mg | ORAL_TABLET | Freq: Every day | ORAL | 3 refills | Status: AC

## 2023-05-24 ENCOUNTER — Other Ambulatory Visit

## 2023-05-25 LAB — COMPREHENSIVE METABOLIC PANEL WITH GFR
ALT: 12 IU/L (ref 0–44)
AST: 20 IU/L (ref 0–40)
Albumin: 4.6 g/dL (ref 3.9–4.9)
Alkaline Phosphatase: 93 IU/L (ref 44–121)
BUN/Creatinine Ratio: 16 (ref 10–24)
BUN: 18 mg/dL (ref 8–27)
Bilirubin Total: 0.3 mg/dL (ref 0.0–1.2)
CO2: 20 mmol/L (ref 20–29)
Calcium: 9.8 mg/dL (ref 8.6–10.2)
Chloride: 103 mmol/L (ref 96–106)
Creatinine, Ser: 1.14 mg/dL (ref 0.76–1.27)
Globulin, Total: 2.5 g/dL (ref 1.5–4.5)
Glucose: 98 mg/dL (ref 70–99)
Potassium: 5.3 mmol/L — ABNORMAL HIGH (ref 3.5–5.2)
Sodium: 143 mmol/L (ref 134–144)
Total Protein: 7.1 g/dL (ref 6.0–8.5)
eGFR: 70 mL/min/{1.73_m2} (ref >59)

## 2023-05-25 LAB — PARATHYROID HORMONE, INTACT (NO CA)

## 2023-05-27 ENCOUNTER — Encounter: Payer: Self-pay | Admitting: Physician Assistant

## 2023-05-28 ENCOUNTER — Encounter: Payer: Self-pay | Admitting: Physician Assistant

## 2023-06-04 ENCOUNTER — Other Ambulatory Visit: Payer: Self-pay | Admitting: Physician Assistant

## 2023-06-04 DIAGNOSIS — R160 Hepatomegaly, not elsewhere classified: Secondary | ICD-10-CM

## 2023-06-06 ENCOUNTER — Other Ambulatory Visit (HOSPITAL_BASED_OUTPATIENT_CLINIC_OR_DEPARTMENT_OTHER): Admitting: Radiology

## 2023-06-06 ENCOUNTER — Ambulatory Visit (INDEPENDENT_AMBULATORY_CARE_PROVIDER_SITE_OTHER)
Admission: RE | Admit: 2023-06-06 | Discharge: 2023-06-06 | Disposition: A | Discharge status: Home / Self Care | Source: Ambulatory Visit | Attending: Physician Assistant | Admitting: Physician Assistant

## 2023-06-06 DIAGNOSIS — K769 Liver disease, unspecified: Secondary | ICD-10-CM | POA: Diagnosis not present

## 2023-06-06 DIAGNOSIS — R160 Hepatomegaly, not elsewhere classified: Secondary | ICD-10-CM

## 2023-06-07 ENCOUNTER — Ambulatory Visit: Payer: Self-pay | Admitting: Physician Assistant

## 2023-11-07 ENCOUNTER — Ambulatory Visit

## 2023-11-07 VITALS — Ht 69.0 in | Wt 146.0 lb

## 2023-11-07 DIAGNOSIS — Z0000 Encounter for general adult medical examination without abnormal findings: Secondary | ICD-10-CM

## 2023-11-07 NOTE — Patient Instructions (Addendum)
 [name],  Thank you for taking the time for your Medicare Wellness Visit. I appreciate your continued commitment to your health goals. Please review the care plan we discussed, and feel free to reach out if I can assist you further.  Medicare recommends these wellness visits once per year to help you and your care team stay ahead of potential health issues. These visits are designed to focus on prevention, allowing your provider to concentrate on managing your acute and chronic conditions during your regular appointments.  Please note that Annual Wellness Visits do not include a physical exam. Some assessments may be limited, especially if the visit was conducted virtually. If needed, we may recommend a separate in-person follow-up with your provider.  Ongoing Care Seeing your primary care provider every 3 to 6 months helps us  monitor your health and provide consistent, personalized care.   Referrals If a referral was made during today's visit and you haven't received any updates within two weeks, please contact the referred provider directly to check on the status.  Recommended Screenings:  Health Maintenance  Topic Date Due   Hepatitis C Screening  Never done   DTaP/Tdap/Td vaccine (1 - Tdap) Never done   Colon Cancer Screening  Never done   Flu Shot  08/23/2023   COVID-19 Vaccine (4 - 2025-26 season) 09/23/2023   Medicare Annual Wellness Visit  11/06/2024   Pneumococcal Vaccine for age over 31  Completed   Zoster (Shingles) Vaccine  Completed   Meningitis B Vaccine  Aged Out       11/07/2023    3:13 PM  Advanced Directives  Does Patient Have a Medical Advance Directive? No  Would patient like information on creating a medical advance directive? Yes (MAU/Ambulatory/Procedural Areas - Information given)   Advance Care Planning is important because it: Ensures you receive medical care that aligns with your values, goals, and preferences. Provides guidance to your family and  loved ones, reducing the emotional burden of decision-making during critical moments.  Information on Advanced Care Planning can be found at [place]  Secretary of [facility] Advance Health Care Directives Advance Health Care Directives ([email or web address]   Vision: Annual vision screenings are recommended for early detection of glaucoma, cataracts, and diabetic retinopathy. These exams can also reveal signs of chronic conditions such as diabetes and high blood pressure.  There are several Eye Doctors in your area. Here are a few that usually accept all insurance types:  [facility]  [address] [place] [address] [phone]   [facility]  [address] [place], [place] [address] [phone]   [facility]  [address] [place], [place] [address] [phone]   [facility]  [address]  [place]  [place], [place] [address] [phone]  Dental: Annual dental screenings help detect early signs of oral cancer, gum disease, and other conditions linked to overall health, including heart disease and diabetes.  Please see the attached documents for additional preventive care recommendations.

## 2023-11-07 NOTE — Progress Notes (Signed)
 Subjective:   James Porter is a 68 y.o. who presents for a Medicare Wellness preventive visit.  As a reminder, Annual Wellness Visits don't include a physical exam, and some assessments may be limited, especially if this visit is performed virtually. We may recommend an in-person follow-up visit with your provider if needed.  Visit Complete: Virtual I connected with  [name] on 11/07/23 by a audio enabled telemedicine application and verified that I am speaking with the correct person using two identifiers.  Patient Location: Home  Provider Location: Home Office  I discussed the limitations of evaluation and management by telemedicine. The patient expressed understanding and agreed to proceed.  Vital Signs: Because this visit was a virtual/telehealth visit, some criteria may be missing or patient reported. Any vitals not documented were not able to be obtained and vitals that have been documented are patient reported.  VideoDeclined- This patient declined Librarian, academic. Therefore the visit was completed with audio only.  Persons Participating in Visit: Patient.  AWV Questionnaire: No: Patient Medicare AWV questionnaire was not completed prior to this visit.  Cardiac Risk Factors include: advanced age (>59men, >42 women);male gender;dyslipidemia     Objective:    Today's Vitals   11/07/23 1427  Weight: 146 lb (66.2 kg)  Height: 5' 9 (1.753 m)   Body mass index is 21.56 kg/m.     11/07/2023    3:13 PM 06/06/2020    6:13 PM 04/17/2017   11:16 AM  Advanced Directives  Does Patient Have a Medical Advance Directive? No No No   Would patient like information on creating a medical advance directive? Yes (MAU/Ambulatory/Procedural Areas - Information given) No - Patient declined Yes (MAU/Ambulatory/Procedural Areas - Information given)      Data saved with a previous flowsheet row definition    Current Medications  (verified) Outpatient Encounter Medications as of 11/07/2023  Medication Sig   atorvastatin  (LIPITOR) 20 MG tablet Take 1 tablet (20 mg total) by mouth daily.   No facility-administered encounter medications on file as of 11/07/2023.    Allergies (verified) Patient has no known allergies.   History: Past Medical History:  Diagnosis Date   Arthritis    CAD (coronary artery disease)    Deep vein thrombosis (DVT) (HCC)    HTN (hypertension)    Hyperlipidemia    Smoker    Past Surgical History:  Procedure Laterality Date   CARPAL TUNNEL RELEASE Right 04/17/2017   Procedure: RIGHT CARPAL TUNNEL RELEASE ENDOSCOPIC WITH ANTERIOR TRANSPOSITION OF ULNAR NERVE OF RIGHT ELBOW;  Surgeon: Edie Norleen PARAS, MD;  Location: [facility];  Service: Orthopedics;  Laterality: Right;   LUMBAR LAMINECTOMY     VASCULAR SURGERY  2011   to remove blood clot in his leg   Family History  Problem Relation Age of Onset   Cirrhosis Father    Social History   Socioeconomic History   Marital status: Widowed    Spouse name: Not on file   Number of children: 3   Years of education: Not on file   Highest education level: Not on file  Occupational History   Not on file  Tobacco Use   Smoking status: Every Day    Current packs/day: 0.50    Types: Cigarettes   Smokeless tobacco: Never  Vaping Use   Vaping status: Every Day  Substance and Sexual Activity   Alcohol use: No   Drug use: No   Sexual activity: Not Currently  Other Topics Concern   Not on file  Social History Narrative   Not on file   Social Drivers of Health   Financial Resource Strain: Low Risk  (11/07/2023)   Overall Financial Resource Strain (CARDIA)    Difficulty of Paying Living Expenses: Not hard at all  Food Insecurity: No Food Insecurity (11/07/2023)   Hunger Vital Sign    Worried About Running Out of Food in the Last Year: Never true    Ran Out of Food in the Last Year: Never true  Transportation Needs: Unmet  Transportation Needs (11/07/2023)   PRAPARE - Administrator, Civil Service (Medical): Yes    Lack of Transportation (Non-Medical): No  Physical Activity: Insufficiently Active (11/07/2023)   Exercise Vital Sign    Days of Exercise per Week: 3 days    Minutes of Exercise per Session: 30 min  Stress: No Stress Concern Present (11/07/2023)   [employer] of Occupational Health - Occupational Stress Questionnaire    Feeling of Stress: Not at all  Social Connections: Socially Isolated (11/07/2023)   Social Connection and Isolation Panel    Frequency of Communication with Friends and Family: Twice a week    Frequency of Social Gatherings with Friends and Family: Three times a week    Attends Religious Services: Never    Active Member of Clubs or Organizations: No    Attends Banker Meetings: Never    Marital Status: Widowed    Tobacco Counseling Ready to quit: Not Answered Counseling given: Not Answered    Clinical Intake:  Pre-visit preparation completed: Yes  Pain : No/denies pain  Diabetes: No  Lab Results  Component Value Date   HGBA1C 5.6 05/16/2023   HGBA1C 6.1 (H) 06/07/2020     How often do you need to have someone help you when you read instructions, pamphlets, or other written materials from your doctor or pharmacy?: 1 - Never  Interpreter Needed?: No  Information entered by :: Charmaine Bloodgood LPN   Activities of Daily Living     11/07/2023    2:28 PM  In your present state of health, do you have any difficulty performing the following activities:  Hearing? 0  Vision? 0  Difficulty concentrating or making decisions? 0  Walking or climbing stairs? 0  Dressing or bathing? 0  Doing errands, shopping? 0  Preparing Food and eating ? N  Using the Toilet? N  In the past six months, have you accidently leaked urine? N  Do you have problems with loss of bowel control? N  Managing your Medications? N  Managing your Finances? N   Housekeeping or managing your Housekeeping? N    Patient Care Team: Milon Cleaves, [place] as PCP - General (Physician Assistant)  I have updated your Care Teams any recent Medical Services you may have received from other providers in the past year.     Assessment:   This is a routine wellness examination for James Porter.  Hearing/Vision screen Hearing Screening - Comments:: Patient is able to hear conversational tones without difficulty. No issues reported.   Vision Screening - Comments:: No vision problems; will schedule routine eye exam   Goals Addressed             This Visit's Progress    Maintain health and independence   On track      Depression Screen     11/07/2023    2:30 PM 05/16/2023   11:02 AM  PHQ 2/9 Scores  PHQ - 2 Score 2 5  PHQ- 9 Score 5 11    Fall Risk     11/07/2023    3:13 PM 05/16/2023    7:53 AM  Fall Risk   Falls in the past year? 0 1  Number falls in past yr: 0 1  Injury with Fall? 0 0  Risk for fall due to : No Fall Risks No Fall Risks  Follow up Falls prevention discussed;Education provided;Falls evaluation completed Falls evaluation completed    MEDICARE RISK AT HOME:  Medicare Risk at Home Any stairs in or around the home?: No If so, are there any without handrails?: No Home free of loose throw rugs in walkways, pet beds, electrical cords, etc?: Yes Adequate lighting in your home to reduce risk of falls?: Yes Life alert?: No Use of a cane, walker or w/c?: No Grab bars in the bathroom?: Yes Shower chair or bench in shower?: No Elevated toilet seat or a handicapped toilet?: No  TIMED UP AND GO:  Was the test performed?  No  Cognitive Function: 6CIT completed        11/07/2023    3:13 PM  6CIT Screen  What Year? 0 points  What month? 0 points  What time? 0 points  Count back from 20 0 points  Months in reverse 0 points  Repeat phrase 0 points  Total Score 0 points    Immunizations Immunization History  Administered  Date(s) Administered   Influenza, Quadrivalent, Recombinant, Inj, Pf 11/06/2016   Influenza,inj,Quad PF,6+ Mos 01/06/2019   Moderna SARS-COV2 Booster Vaccination 06/09/2020   Moderna Sars-Covid-2 Vaccination 02/12/2019, 03/13/2019   Pneumococcal Conjugate-13 01/10/2017   Pneumococcal Polysaccharide-23 06/09/2020   Zoster Recombinant(Shingrix) 01/10/2017, 05/14/2017    Screening Tests Health Maintenance  Topic Date Due   Hepatitis C Screening  Never done   DTaP/Tdap/Td (1 - Tdap) Never done   Colonoscopy  Never done   Influenza Vaccine  08/23/2023   COVID-19 Vaccine (4 - 2025-26 season) 09/23/2023   Medicare Annual Wellness (AWV)  11/06/2024   Pneumococcal Vaccine: 50+ Years  Completed   Zoster Vaccines- Shingrix  Completed   Meningococcal B Vaccine  Aged Out    Health Maintenance Items Addressed: Vaccines Due: Flu and TDap Declines colon screening at this time   Additional Screening:  Vision Screening: Recommended annual ophthalmology exams for early detection of glaucoma and other disorders of the eye. Is the patient up to date with their annual eye exam?  No  Who is the provider or what is the name of the office in which the patient attends annual eye exams? none  Dental Screening: Recommended annual dental exams for proper oral hygiene  Community Resource Referral / Chronic Care Management: CRR required this visit?  No   CCM required this visit?  No   Plan:    I have personally reviewed and noted the following in the patient's chart:   Medical and social history Use of alcohol, tobacco or illicit drugs  Current medications and supplements including opioid prescriptions. Patient is not currently taking opioid prescriptions. Functional ability and status Nutritional status Physical activity Advanced directives List of other physicians Hospitalizations, surgeries, and ER visits in previous 12 months Vitals Screenings to include cognitive, depression, and  falls Referrals and appointments  In addition, I have reviewed and discussed with patient certain preventive protocols, quality metrics, and best practice recommendations. A written personalized care plan for preventive services as well as general preventive health recommendations were provided to  patient.   Lavelle Pfeiffer [place], [place]   [id number]   After Visit Summary: (MyChart) Due to this being a telephonic visit, the after visit summary with patients personalized plan was offered to patient via MyChart   Notes: Nothing significant to report at this time.

## 2024-11-12 ENCOUNTER — Ambulatory Visit
